# Patient Record
Sex: Female | Born: 1950 | Hispanic: No | Marital: Single | Smoking: Never smoker
Health system: Southern US, Community
[De-identification: ages and names within clinical notes are randomized; demographics above are authoritative.]

## PROBLEM LIST (undated history)

## (undated) DIAGNOSIS — R51 Headache: Secondary | ICD-10-CM

## (undated) DIAGNOSIS — R5381 Other malaise: Secondary | ICD-10-CM

## (undated) DIAGNOSIS — M1990 Unspecified osteoarthritis, unspecified site: Secondary | ICD-10-CM

## (undated) DIAGNOSIS — R600 Localized edema: Secondary | ICD-10-CM

## (undated) DIAGNOSIS — I10 Essential (primary) hypertension: Secondary | ICD-10-CM

## (undated) DIAGNOSIS — R5383 Other fatigue: Secondary | ICD-10-CM

## (undated) DIAGNOSIS — R519 Headache, unspecified: Secondary | ICD-10-CM

## (undated) HISTORY — PX: OTHER SURGICAL HISTORY: SHX169

## (undated) HISTORY — PX: LEFT OOPHORECTOMY: SHX1961

## (undated) HISTORY — PX: TUBAL LIGATION: SHX77

## (undated) HISTORY — PX: ABDOMINAL HYSTERECTOMY: SHX81

## (undated) HISTORY — PX: DILATION AND CURETTAGE OF UTERUS: SHX78

## (undated) HISTORY — PX: TOTAL HIP ARTHROPLASTY: SHX124

## (undated) HISTORY — DX: Other fatigue: R53.83

## (undated) HISTORY — DX: Localized edema: R60.0

## (undated) HISTORY — PX: CHOLECYSTECTOMY: SHX55

## (undated) HISTORY — DX: Other malaise: R53.81

## (undated) HISTORY — PX: JOINT REPLACEMENT: SHX530

## (undated) HISTORY — PX: BACK SURGERY: SHX140

---

## 1998-06-12 ENCOUNTER — Emergency Department (HOSPITAL_COMMUNITY): Admission: EM | Admit: 1998-06-12 | Discharge: 1998-06-12 | Payer: Self-pay | Admitting: Emergency Medicine

## 1998-06-12 ENCOUNTER — Encounter: Payer: Self-pay | Admitting: Emergency Medicine

## 2016-11-07 ENCOUNTER — Encounter: Payer: Self-pay | Admitting: Orthopedic Surgery

## 2016-11-07 DIAGNOSIS — M1612 Unilateral primary osteoarthritis, left hip: Secondary | ICD-10-CM

## 2016-11-07 NOTE — H&P (Signed)
PREOPERATIVE H&P Patient ID: Janice Werner MRN: [id number] DOB/AGE: 05-04-1950 66 y.o.  Chief Complaint: OA LEFT HIP  Planned Procedure Date: 12/06/16 Medical and Cardiac Clearance by Azell Der, PA-C pending    HPI: Janice Werner is a 66 y.o. female who presents for evaluation of OA LEFT HIP. The patient has a history of pain and functional disability in the left hip due to arthritis and has failed non-surgical conservative treatments for greater than 12 weeks to include NSAID's and/or analgesics, use of assistive devices and weight reduction as appropriate.  Onset of symptoms was gradual, starting 9 years ago with gradually worsening course since that time.  Patient currently rates pain at 10 out of 10 with activity. Patient has night pain, worsening of pain with activity and weight bearing and pain that interferes with activities of daily living.  Patient has evidence of subchondral cysts, subchondral sclerosis, periarticular osteophytes and joint space narrowing by imaging studies. There is no active infection.  Past Medical History: Denies chronic medical problems excepting chronic left hip pain.  Past Surgical History: Bone spur right foot 1976 Tubo ligation 1979 D&C 1980 Hysterectomy 1980 Cholecystectomy 1982  Left oophorectomy 1985 Lumbar disc surgery for fracture 1992  Allergies: Codine - GI upset.    Medications: OTC Tylenol prn pain in left hip    Social History: Single retired Midwife.  Never smoker, no etoh.  Lives alone with 7 cats.  Has a daughter that lives 15 minutes away.  Family History: Mother and Father with HTN  ROS: Currently denies lightheadedness, dizziness, Fever, chills, CP, SOB.  No personal history of DVT, PE, MI, or CVA. No loose teeth.  Top dentures All other systems have been reviewed and were otherwise currently negative with the exception of those mentioned in the HPI and as above.  Objective: Vitals: Ht: 5'5" Wt: 247  Temp: 98.2 BP: 145/74 Pulse: 92 O2 96% on room air. Physical Exam: General: Alert, NAD. Trendelenberg Gait  HEENT: EOMI, Good Neck Extension  Pulm: No increased work of breathing.  Clear B/L A/P w/o crackle or wheeze.  CV: RRR, No m/g/r appreciated  GI: soft, NT, ND Neuro: Neuro without gross focal deficit.  Sensation intact distally Skin: No lesions in the area of chief complaint MSK/Surgical Site: Left Hip tender over greater trochanter.  Pain with passive ROM.  Positive Stinchfield.  5/5 strength.  NVI.  Sensation intact distally.  Imaging Review Plain radiographs demonstrate severe degenerative joint disease of the left hip.   Assessment: OA LEFT HIP Principal Problem:   Primary osteoarthritis of left hip   Plan: Plan for Procedure(s): TOTAL HIP ARTHROPLASTY ANTERIOR APPROACH  The patient history, physical exam, clinical judgement of the provider and imaging are consistent with end stage degenerative joint disease and total joint arthroplasty is deemed medically necessary. The treatment options including medical management, injection therapy, and arthroplasty were discussed at length. The risks and benefits of Procedure(s): TOTAL HIP ARTHROPLASTY ANTERIOR APPROACH were presented and reviewed.  The risks of nonoperative treatment, versus surgical intervention including but not limited to continued pain, aseptic loosening, stiffness, dislocation/subluxation, infection, bleeding, nerve injury, blood clots, cardiopulmonary complications, morbidity, mortality, among others were discussed. The patient verbalizes understanding and wishes to proceed with the plan.  Patient is being admitted for inpatient treatment for surgery, pain control, PT, OT, prophylactic antibiotics, VTE prophylaxis, progressive ambulation, ADL's and discharge planning.   Dental prophylaxis discussed and recommended for 2 years postoperatively.   The patient does meet the criteria for TXA  which will be used  perioperatively via IV.    ASA 325 mg will be used postoperatively for DVT prophylaxis in addition to SCDs, and early ambulation.  The patient is planning to be discharged home with home health services ([facility]) in care of her daughter.  Albina Billet III, PA-C 11/07/2016 4:51 PM

## 2016-11-16 ENCOUNTER — Other Ambulatory Visit: Payer: Self-pay | Admitting: Cardiology

## 2016-11-16 DIAGNOSIS — I10 Essential (primary) hypertension: Secondary | ICD-10-CM

## 2016-11-25 ENCOUNTER — Encounter (HOSPITAL_COMMUNITY): Payer: Self-pay

## 2016-11-25 ENCOUNTER — Encounter (HOSPITAL_COMMUNITY)
Admission: RE | Admit: 2016-11-25 | Discharge: 2016-11-25 | Disposition: A | Discharge status: Home / Self Care | Payer: Medicare HMO | Source: Ambulatory Visit | Attending: Orthopedic Surgery | Admitting: Orthopedic Surgery

## 2016-11-25 DIAGNOSIS — Z01818 Encounter for other preprocedural examination: Secondary | ICD-10-CM | POA: Diagnosis present

## 2016-11-25 HISTORY — DX: Essential (primary) hypertension: I10

## 2016-11-25 HISTORY — DX: Unspecified osteoarthritis, unspecified site: M19.90

## 2016-11-25 HISTORY — DX: Headache, unspecified: R51.9

## 2016-11-25 HISTORY — DX: Headache: R51

## 2016-11-25 LAB — CBC
HEMATOCRIT: 42.7 % (ref 36.0–46.0)
Hemoglobin: 13.7 g/dL (ref 12.0–15.0)
MCH: 29.7 pg (ref 26.0–34.0)
MCHC: 32.1 g/dL (ref 30.0–36.0)
MCV: 92.4 fL (ref 78.0–100.0)
Platelets: 264 10*3/uL (ref 150–400)
RBC: 4.62 MIL/uL (ref 3.87–5.11)
RDW: 14.6 % (ref 11.5–15.5)
WBC: 6.5 10*3/uL (ref 4.0–10.5)

## 2016-11-25 LAB — BASIC METABOLIC PANEL
ANION GAP: 9 (ref 5–15)
BUN: 18 mg/dL (ref 6–20)
CALCIUM: 9 mg/dL (ref 8.9–10.3)
CO2: 24 mmol/L (ref 22–32)
Chloride: 109 mmol/L (ref 101–111)
Creatinine, Ser: 0.83 mg/dL (ref 0.44–1.00)
GFR calc non Af Amer: >60 mL/min (ref >60)
GLUCOSE: 93 mg/dL (ref 65–99)
POTASSIUM: 3.8 mmol/L (ref 3.5–5.1)
Sodium: 142 mmol/L (ref 135–145)

## 2016-11-25 LAB — SURGICAL PCR SCREEN
MRSA, PCR: NEGATIVE
Staphylococcus aureus: NEGATIVE

## 2016-11-25 NOTE — Progress Notes (Signed)
Dr. Jorja Loaim Murphy's office notified that we do not have TED's that will fit pt. Calf size.

## 2016-11-25 NOTE — Pre-Procedure Instructions (Addendum)
    Janice Werner  11/25/2016        Your procedure is scheduled on 12-06-2016  Tuesday .  [facility]:00 [facility].M.   Call this number if you have problems the morning of surgery:  [phone]   Remember:  Do not eat food or drink liquids after midnight.   Take these medicines the morning of surgery with A SIP OF WATER Amlodipine   STOP ASPIRIN,ANTIINFLAMATORIES (IBUPROFEN,ALEVE,MOTRIN,ADVIL,GOODY'S POWDERS),HERBAL SUPPLEMENTS,FISH OIL,AND VITAMINS 5-7 DAYS PRIOR TO SURGERY   Do not wear jewelry, make-up or nail polish.  Do not wear lotions, powders, or perfumes, or deoderant.  Do not shave 48 hours prior to surgery.  Men may shave face and neck.   Do not bring valuables to the hospital.  [facility] is not responsible for any belongings or valuables.  Contacts, dentures or bridgework may not be worn into surgery.  Leave your suitcase in the car.  After surgery it may be brought to your room.  For patients admitted to the hospital, discharge time will be determined by your treatment team.  Patients discharged the day of surgery will not be allowed to drive home.      Please read over the following fact sheets that you were given. MRSA Information and Surgical Site Infection Prevention,Incentive Spirometry

## 2016-11-25 NOTE — Progress Notes (Addendum)
PCP Dr. Charlies SilversJennifer Couillard PA  Cardiologist  Dr. Jacinto HalimGanji  Stress test  11-22-2016  Dr. Jacinto HalimGanji   Echo 11-24-2016  EKG 11-25-2016  Dr. Ardyth GalJanji   All Cardiac testing , ov , EKG requested from Dr. Mickle MalloryJanji's office

## 2016-11-28 NOTE — Progress Notes (Signed)
Anesthesia Chart Review:  Pt is a 66 year old female scheduled for L total hip arthroplasty anterior approach on 12/06/2016 with Margarita Ranaimothy Murphy, MD  - PCP is Charlies SilversJennifer Couillard, PA - Saw Lunette StandsAston Kelley, NP at [facility] on 11/16/16 for pre-op eval.  Stress test and echo ordered, results below.   PMH includes:  HTN. Never smoker. BMI 39  Medications include: amlodipine, losartan.   Preoperative labs reviewed.    EKG 8/259/18 [facility]): NSR. Nonspecific T abnormality. Low voltage complexes.   Nuclear stress test 11/25/16 [facility]):  1. Resting EKG NSR, poor R wave progression. Stress EKG nondiagnostic as test is pharmacologic. Stress symptoms include: dyspnea, dizziness 2. Raw images reveal breast attenuation in the inferior and septal regions. Superimposed small sized moderate ischemia in the mid anteroseptal and apical septal myocardial walls cannot be excluded. Gated SPECT images reveal normal myocardial thickening and wall motion. LV EF calculated to be 56%. This is a low risk study.   [facility]):  1. LV cavity normal in size. Mild concentric LVH. Normal global wall motion. Indeterminate diastolic filling pattern. Calculated EF 61%.  2. LA cavity is mildly dilated.  3. Mild (Grade I) mitral regurgitation.  4. Mild to moderate tricuspid regurgitation. Pulmonary artery systolic pressure is estimated at 28-33 mmHg.   If no changes, I anticipate pt can proceed with surgery as scheduled.   Rica Mastngela Tala Eber, FNP-BC [facility]/Anesthesiology Phone: [phone] 11/28/2016 4:38 PM

## 2016-12-05 ENCOUNTER — Other Ambulatory Visit: Payer: Self-pay | Admitting: Radiology

## 2016-12-05 MED ORDER — TRANEXAMIC ACID 1000 MG/10ML IV SOLN
2000.0000 mg | INTRAVENOUS | Status: AC
  Administered 2016-12-06: 2000 mg via TOPICAL
  Filled 2016-12-05: qty 20

## 2016-12-05 MED ORDER — CEFAZOLIN SODIUM-DEXTROSE 2-4 GM/100ML-% IV SOLN
2.0000 g | INTRAVENOUS | Status: AC
  Administered 2016-12-06: 2 g via INTRAVENOUS
  Filled 2016-12-05: qty 100

## 2016-12-05 MED ORDER — TRANEXAMIC ACID 1000 MG/10ML IV SOLN
1000.0000 mg | INTRAVENOUS | Status: AC
  Administered 2016-12-06: 1000 mg via INTRAVENOUS
  Filled 2016-12-05: qty 10

## 2016-12-05 MED ORDER — LACTATED RINGERS IV SOLN: INTRAVENOUS | Status: DC

## 2016-12-05 MED ORDER — GABAPENTIN 300 MG PO CAPS
300.0000 mg | ORAL_CAPSULE | Freq: Once | ORAL | Status: AC
  Administered 2016-12-06: 300 mg via ORAL
  Filled 2016-12-05: qty 1

## 2016-12-05 MED ORDER — ACETAMINOPHEN 500 MG PO TABS
1000.0000 mg | ORAL_TABLET | Freq: Once | ORAL | Status: AC
  Administered 2016-12-06: 1000 mg via ORAL
  Filled 2016-12-05: qty 2

## 2016-12-06 ENCOUNTER — Inpatient Hospital Stay (HOSPITAL_COMMUNITY): Payer: Medicare HMO

## 2016-12-06 ENCOUNTER — Encounter (HOSPITAL_COMMUNITY)
Admission: RE | Disposition: A | Discharge status: Home Health | Payer: Self-pay | Source: Ambulatory Visit | Attending: Orthopedic Surgery

## 2016-12-06 ENCOUNTER — Inpatient Hospital Stay (HOSPITAL_COMMUNITY): Payer: Medicare HMO | Admitting: Emergency Medicine

## 2016-12-06 ENCOUNTER — Encounter (HOSPITAL_COMMUNITY): Payer: Self-pay | Admitting: *Deleted

## 2016-12-06 ENCOUNTER — Inpatient Hospital Stay (HOSPITAL_COMMUNITY)
Admission: RE | Admit: 2016-12-06 | Discharge: 2016-12-08 | DRG: 470 | Disposition: A | Discharge status: Home Health | Payer: Medicare HMO | Source: Ambulatory Visit | Attending: Orthopedic Surgery | Admitting: Orthopedic Surgery

## 2016-12-06 DIAGNOSIS — I10 Essential (primary) hypertension: Secondary | ICD-10-CM | POA: Diagnosis present

## 2016-12-06 DIAGNOSIS — E669 Obesity, unspecified: Secondary | ICD-10-CM | POA: Diagnosis present

## 2016-12-06 DIAGNOSIS — M1612 Unilateral primary osteoarthritis, left hip: Secondary | ICD-10-CM | POA: Diagnosis present

## 2016-12-06 DIAGNOSIS — Z885 Allergy status to narcotic agent status: Secondary | ICD-10-CM

## 2016-12-06 DIAGNOSIS — Z96649 Presence of unspecified artificial hip joint: Secondary | ICD-10-CM

## 2016-12-06 DIAGNOSIS — Z419 Encounter for procedure for purposes other than remedying health state, unspecified: Secondary | ICD-10-CM

## 2016-12-06 HISTORY — PX: TOTAL HIP ARTHROPLASTY: SHX124

## 2016-12-06 SURGERY — ARTHROPLASTY, HIP, TOTAL, ANTERIOR APPROACH
Anesthesia: Spinal | Site: Hip | Laterality: Left

## 2016-12-06 MED ORDER — ONDANSETRON HCL 4 MG PO TABS: 4.0000 mg | ORAL_TABLET | Freq: Three times a day (TID) | ORAL | 0 refills | Status: DC | PRN

## 2016-12-06 MED ORDER — FLEET ENEMA 7-19 GM/118ML RE ENEM: 1.0000 | ENEMA | Freq: Once | RECTAL | Status: DC | PRN

## 2016-12-06 MED ORDER — FENTANYL CITRATE (PF) 100 MCG/2ML IJ SOLN
INTRAMUSCULAR | Status: DC | PRN
  Administered 2016-12-06: 50 ug via INTRAVENOUS

## 2016-12-06 MED ORDER — MENTHOL 3 MG MT LOZG: 1.0000 | LOZENGE | OROMUCOSAL | Status: DC | PRN

## 2016-12-06 MED ORDER — POLYETHYLENE GLYCOL 3350 17 G PO PACK: 17.0000 g | PACK | Freq: Every day | ORAL | Status: DC | PRN

## 2016-12-06 MED ORDER — SODIUM CHLORIDE FLUSH 0.9 % IV SOLN
INTRAVENOUS | Status: DC | PRN
  Administered 2016-12-06 (×3): 10 mL

## 2016-12-06 MED ORDER — METOCLOPRAMIDE HCL 5 MG/ML IJ SOLN: 5.0000 mg | Freq: Three times a day (TID) | INTRAMUSCULAR | Status: DC | PRN

## 2016-12-06 MED ORDER — SORBITOL 70 % SOLN: 30.0000 mL | Freq: Every day | Status: DC | PRN

## 2016-12-06 MED ORDER — ONDANSETRON HCL 4 MG/2ML IJ SOLN: 4.0000 mg | Freq: Four times a day (QID) | INTRAMUSCULAR | Status: DC | PRN

## 2016-12-06 MED ORDER — BUPIVACAINE-EPINEPHRINE 0.25% -1:200000 IJ SOLN
INTRAMUSCULAR | Status: DC | PRN
  Administered 2016-12-06: 30 mL

## 2016-12-06 MED ORDER — BUPIVACAINE IN DEXTROSE 0.75-8.25 % IT SOLN
INTRATHECAL | Status: DC | PRN
  Administered 2016-12-06: 2 mL via INTRATHECAL

## 2016-12-06 MED ORDER — HYDROMORPHONE HCL 1 MG/ML IJ SOLN
0.5000 mg | INTRAMUSCULAR | Status: DC | PRN
  Administered 2016-12-06: 1 mg via INTRAVENOUS
  Filled 2016-12-06: qty 1

## 2016-12-06 MED ORDER — DEXAMETHASONE SODIUM PHOSPHATE 10 MG/ML IJ SOLN
10.0000 mg | Freq: Once | INTRAMUSCULAR | Status: AC
  Administered 2016-12-07: 10 mg via INTRAVENOUS
  Filled 2016-12-06: qty 1

## 2016-12-06 MED ORDER — LACTATED RINGERS IV SOLN
INTRAVENOUS | Status: DC
  Administered 2016-12-06: 23:00:00 via INTRAVENOUS

## 2016-12-06 MED ORDER — OMEPRAZOLE 20 MG PO CPDR: 20.0000 mg | DELAYED_RELEASE_CAPSULE | Freq: Every day | ORAL | 0 refills | Status: DC

## 2016-12-06 MED ORDER — SENNA 8.6 MG PO TABS
1.0000 | ORAL_TABLET | Freq: Two times a day (BID) | ORAL | Status: DC
  Administered 2016-12-06 – 2016-12-08 (×4): 8.6 mg via ORAL
  Filled 2016-12-06 (×4): qty 1

## 2016-12-06 MED ORDER — LOSARTAN POTASSIUM 25 MG PO TABS
25.0000 mg | ORAL_TABLET | Freq: Every day | ORAL | Status: DC
  Administered 2016-12-06: 25 mg via ORAL
  Filled 2016-12-06 (×2): qty 1

## 2016-12-06 MED ORDER — MIDAZOLAM HCL 5 MG/5ML IJ SOLN
INTRAMUSCULAR | Status: DC | PRN
  Administered 2016-12-06: 2 mg via INTRAVENOUS

## 2016-12-06 MED ORDER — DIPHENHYDRAMINE HCL 12.5 MG/5ML PO ELIX: 12.5000 mg | ORAL_SOLUTION | ORAL | Status: DC | PRN

## 2016-12-06 MED ORDER — 0.9 % SODIUM CHLORIDE (POUR BTL) OPTIME
TOPICAL | Status: DC | PRN
  Administered 2016-12-06: 1000 mL

## 2016-12-06 MED ORDER — ACETAMINOPHEN 325 MG PO TABS: 650.0000 mg | ORAL_TABLET | Freq: Four times a day (QID) | ORAL | Status: DC | PRN

## 2016-12-06 MED ORDER — HYDROCODONE-ACETAMINOPHEN 5-325 MG PO TABS
1.0000 | ORAL_TABLET | ORAL | Status: DC | PRN
  Administered 2016-12-06: 1 via ORAL
  Administered 2016-12-06 – 2016-12-07 (×3): 2 via ORAL
  Administered 2016-12-07: 1 via ORAL
  Administered 2016-12-08: 2 via ORAL
  Filled 2016-12-06: qty 2
  Filled 2016-12-06 (×2): qty 1
  Filled 2016-12-06 (×3): qty 2

## 2016-12-06 MED ORDER — ASPIRIN EC 325 MG PO TBEC: 325.0000 mg | DELAYED_RELEASE_TABLET | Freq: Every day | ORAL | 0 refills | Status: DC

## 2016-12-06 MED ORDER — AMLODIPINE BESYLATE 5 MG PO TABS
5.0000 mg | ORAL_TABLET | Freq: Every day | ORAL | Status: DC
  Administered 2016-12-08: 5 mg via ORAL
  Filled 2016-12-06 (×2): qty 1

## 2016-12-06 MED ORDER — BUPIVACAINE-EPINEPHRINE (PF) 0.25% -1:200000 IJ SOLN
INTRAMUSCULAR | Status: AC
  Filled 2016-12-06: qty 30

## 2016-12-06 MED ORDER — CELECOXIB 200 MG PO CAPS
200.0000 mg | ORAL_CAPSULE | Freq: Two times a day (BID) | ORAL | 0 refills | Status: AC
Start: 2016-12-06 — End: 2017-12-06

## 2016-12-06 MED ORDER — CEFAZOLIN SODIUM-DEXTROSE 2-4 GM/100ML-% IV SOLN
2.0000 g | Freq: Four times a day (QID) | INTRAVENOUS | Status: AC
  Administered 2016-12-06 – 2016-12-07 (×2): 2 g via INTRAVENOUS
  Filled 2016-12-06 (×2): qty 100

## 2016-12-06 MED ORDER — CHLORHEXIDINE GLUCONATE 4 % EX LIQD: 60.0000 mL | Freq: Once | CUTANEOUS | Status: DC

## 2016-12-06 MED ORDER — METHOCARBAMOL 500 MG PO TABS
ORAL_TABLET | ORAL | Status: AC
  Filled 2016-12-06: qty 1

## 2016-12-06 MED ORDER — LIDOCAINE HCL (CARDIAC) 20 MG/ML IV SOLN
INTRAVENOUS | Status: DC | PRN
  Administered 2016-12-06: 60 mg via INTRAVENOUS

## 2016-12-06 MED ORDER — PROPOFOL 500 MG/50ML IV EMUL
INTRAVENOUS | Status: DC | PRN
  Administered 2016-12-06: 40 ug/kg/min via INTRAVENOUS

## 2016-12-06 MED ORDER — HYDROCODONE-ACETAMINOPHEN 5-325 MG PO TABS: 1.0000 | ORAL_TABLET | ORAL | 0 refills | Status: DC | PRN

## 2016-12-06 MED ORDER — METOCLOPRAMIDE HCL 5 MG PO TABS: 5.0000 mg | ORAL_TABLET | Freq: Three times a day (TID) | ORAL | Status: DC | PRN

## 2016-12-06 MED ORDER — DOCUSATE SODIUM 100 MG PO CAPS: 100.0000 mg | ORAL_CAPSULE | Freq: Two times a day (BID) | ORAL | 0 refills | Status: DC

## 2016-12-06 MED ORDER — FENTANYL CITRATE (PF) 100 MCG/2ML IJ SOLN
25.0000 ug | INTRAMUSCULAR | Status: DC | PRN
  Administered 2016-12-06 (×3): 50 ug via INTRAVENOUS

## 2016-12-06 MED ORDER — KETOROLAC TROMETHAMINE 30 MG/ML IJ SOLN
INTRAMUSCULAR | Status: DC | PRN
  Administered 2016-12-06: 30 mg via INTRA_ARTICULAR

## 2016-12-06 MED ORDER — ONDANSETRON HCL 4 MG PO TABS: 4.0000 mg | ORAL_TABLET | Freq: Four times a day (QID) | ORAL | Status: DC | PRN

## 2016-12-06 MED ORDER — ASPIRIN EC 325 MG PO TBEC
325.0000 mg | DELAYED_RELEASE_TABLET | Freq: Every day | ORAL | Status: DC
  Administered 2016-12-07 – 2016-12-08 (×2): 325 mg via ORAL
  Filled 2016-12-06 (×2): qty 1

## 2016-12-06 MED ORDER — PRAVASTATIN SODIUM 20 MG PO TABS
10.0000 mg | ORAL_TABLET | Freq: Every day | ORAL | Status: DC
  Administered 2016-12-07: 10 mg via ORAL
  Filled 2016-12-06: qty 1

## 2016-12-06 MED ORDER — FENTANYL CITRATE (PF) 100 MCG/2ML IJ SOLN
INTRAMUSCULAR | Status: AC
  Administered 2016-12-06: 50 ug via INTRAVENOUS
  Filled 2016-12-06: qty 2

## 2016-12-06 MED ORDER — METHOCARBAMOL 500 MG PO TABS
500.0000 mg | ORAL_TABLET | Freq: Four times a day (QID) | ORAL | Status: DC | PRN
  Administered 2016-12-06 – 2016-12-07 (×3): 500 mg via ORAL
  Filled 2016-12-06 (×2): qty 1

## 2016-12-06 MED ORDER — LACTATED RINGERS IV SOLN
INTRAVENOUS | Status: DC
  Administered 2016-12-06: 11:00:00 via INTRAVENOUS

## 2016-12-06 MED ORDER — PHENOL 1.4 % MT LIQD: 1.0000 | OROMUCOSAL | Status: DC | PRN

## 2016-12-06 MED ORDER — FENTANYL CITRATE (PF) 250 MCG/5ML IJ SOLN
INTRAMUSCULAR | Status: AC
  Filled 2016-12-06: qty 5

## 2016-12-06 MED ORDER — METHOCARBAMOL 1000 MG/10ML IJ SOLN
500.0000 mg | Freq: Four times a day (QID) | INTRAVENOUS | Status: DC | PRN
  Filled 2016-12-06: qty 5

## 2016-12-06 MED ORDER — METHOCARBAMOL 500 MG PO TABS: 500.0000 mg | ORAL_TABLET | Freq: Four times a day (QID) | ORAL | 0 refills | Status: DC | PRN

## 2016-12-06 MED ORDER — LACTATED RINGERS IV SOLN
INTRAVENOUS | Status: DC | PRN
  Administered 2016-12-06 (×2): via INTRAVENOUS

## 2016-12-06 MED ORDER — PROPOFOL 10 MG/ML IV BOLUS
INTRAVENOUS | Status: AC
  Filled 2016-12-06: qty 20

## 2016-12-06 MED ORDER — MIDAZOLAM HCL 2 MG/2ML IJ SOLN
INTRAMUSCULAR | Status: AC
  Filled 2016-12-06: qty 2

## 2016-12-06 MED ORDER — HYDROCODONE-ACETAMINOPHEN 5-325 MG PO TABS
ORAL_TABLET | ORAL | Status: AC
  Filled 2016-12-06: qty 2

## 2016-12-06 MED ORDER — ONDANSETRON HCL 4 MG/2ML IJ SOLN: 4.0000 mg | Freq: Once | INTRAMUSCULAR | Status: DC | PRN

## 2016-12-06 MED ORDER — FENTANYL CITRATE (PF) 100 MCG/2ML IJ SOLN
INTRAMUSCULAR | Status: AC
  Filled 2016-12-06: qty 2

## 2016-12-06 MED ORDER — CELECOXIB 200 MG PO CAPS
200.0000 mg | ORAL_CAPSULE | Freq: Two times a day (BID) | ORAL | Status: DC
  Administered 2016-12-06 – 2016-12-08 (×4): 200 mg via ORAL
  Filled 2016-12-06 (×5): qty 1

## 2016-12-06 MED ORDER — DOCUSATE SODIUM 100 MG PO CAPS
100.0000 mg | ORAL_CAPSULE | Freq: Two times a day (BID) | ORAL | Status: DC
  Administered 2016-12-06 – 2016-12-08 (×4): 100 mg via ORAL
  Filled 2016-12-06 (×4): qty 1

## 2016-12-06 MED ORDER — PHENYLEPHRINE HCL 10 MG/ML IJ SOLN
INTRAVENOUS | Status: DC | PRN
  Administered 2016-12-06: 15 ug/min via INTRAVENOUS

## 2016-12-06 MED ORDER — ACETAMINOPHEN 650 MG RE SUPP: 650.0000 mg | Freq: Four times a day (QID) | RECTAL | Status: DC | PRN

## 2016-12-06 SURGICAL SUPPLY — 50 items
BAG DECANTER FOR FLEXI CONT (MISCELLANEOUS) ×2 IMPLANT
BLADE SAG 18X100X1.27 (BLADE) ×2 IMPLANT
CAPT HIP TOTAL 2 ×3 IMPLANT
CLOSURE STERI-STRIP 1/2X4 (GAUZE/BANDAGES/DRESSINGS) ×1
CLOSURE WOUND 1/2 X4 (GAUZE/BANDAGES/DRESSINGS) ×1
CLSR STERI-STRIP ANTIMIC 1/2X4 (GAUZE/BANDAGES/DRESSINGS) ×2 IMPLANT
COVER PERINEAL POST (MISCELLANEOUS) ×3 IMPLANT
COVER SURGICAL LIGHT HANDLE (MISCELLANEOUS) ×3 IMPLANT
DRAPE C-ARM 42X72 X-RAY (DRAPES) ×3 IMPLANT
DRAPE STERI IOBAN 125X83 (DRAPES) ×3 IMPLANT
DRAPE U-SHAPE 47X51 STRL (DRAPES) IMPLANT
DRSG MEPILEX BORDER 4X8 (GAUZE/BANDAGES/DRESSINGS) ×3 IMPLANT
DURAPREP 26ML APPLICATOR (WOUND CARE) ×3 IMPLANT
ELECT BLADE 4.0 EZ CLEAN MEGAD (MISCELLANEOUS) ×3
ELECT REM PT RETURN 9FT ADLT (ELECTROSURGICAL) ×3
ELECTRODE BLDE 4.0 EZ CLN MEGD (MISCELLANEOUS) ×1 IMPLANT
ELECTRODE REM PT RTRN 9FT ADLT (ELECTROSURGICAL) ×1 IMPLANT
FACESHIELD WRAPAROUND (MASK) ×6 IMPLANT
FACESHIELD WRAPAROUND OR TEAM (MASK) ×2 IMPLANT
GLOVE BIO SURGEON STRL SZ7.5 (GLOVE) ×6 IMPLANT
GLOVE BIOGEL PI IND STRL 8 (GLOVE) ×2 IMPLANT
GLOVE BIOGEL PI INDICATOR 8 (GLOVE) ×4
GOWN STRL REUS W/ TWL LRG LVL3 (GOWN DISPOSABLE) ×2 IMPLANT
GOWN STRL REUS W/TWL LRG LVL3 (GOWN DISPOSABLE) ×6
KIT BASIN OR (CUSTOM PROCEDURE TRAY) ×3 IMPLANT
KIT ROOM TURNOVER OR (KITS) ×3 IMPLANT
MANIFOLD NEPTUNE II (INSTRUMENTS) ×3 IMPLANT
NDL SAFETY ECLIPSE 18X1.5 (NEEDLE) IMPLANT
NEEDLE HYPO 18GX1.5 SHARP (NEEDLE) ×3
NEEDLE HYPO 22GX1.5 SAFETY (NEEDLE) ×3 IMPLANT
NS IRRIG 1000ML POUR BTL (IV SOLUTION) ×3 IMPLANT
PACK TOTAL JOINT (CUSTOM PROCEDURE TRAY) ×3 IMPLANT
PAD ARMBOARD 7.5X6 YLW CONV (MISCELLANEOUS) ×3 IMPLANT
SPONGE LAP 18X18 X RAY DECT (DISPOSABLE) IMPLANT
STRIP CLOSURE SKIN 1/2X4 (GAUZE/BANDAGES/DRESSINGS) ×2 IMPLANT
SUT MNCRL AB 4-0 PS2 18 (SUTURE) ×3 IMPLANT
SUT MON AB 2-0 CT1 36 (SUTURE) ×3 IMPLANT
SUT VIC AB 0 CT1 27 (SUTURE) ×3
SUT VIC AB 0 CT1 27XBRD ANBCTR (SUTURE) ×1 IMPLANT
SUT VIC AB 1 CT1 27 (SUTURE) ×3
SUT VIC AB 1 CT1 27XBRD ANBCTR (SUTURE) ×1 IMPLANT
SYR 50ML LL SCALE MARK (SYRINGE) ×3 IMPLANT
SYR BULB IRRIGATION 50ML (SYRINGE) ×3 IMPLANT
SYRINGE 20CC LL (MISCELLANEOUS) IMPLANT
TOWEL OR 17X24 6PK STRL BLUE (TOWEL DISPOSABLE) ×3 IMPLANT
TOWEL OR 17X26 10 PK STRL BLUE (TOWEL DISPOSABLE) ×3 IMPLANT
TRAY CATH 16FR W/PLASTIC CATH (SET/KITS/TRAYS/PACK) IMPLANT
TRAY FOLEY W/METER SILVER 16FR (SET/KITS/TRAYS/PACK) IMPLANT
WATER STERILE IRR 1000ML POUR (IV SOLUTION) ×3 IMPLANT
YANKAUER SUCT BULB TIP NO VENT (SUCTIONS) ×6 IMPLANT

## 2016-12-06 NOTE — Op Note (Signed)
12/06/2016  3:23 PM  PATIENT:  Janice Werner   MRN: [id number]  PRE-OPERATIVE DIAGNOSIS:  OA LEFT HIP  POST-OPERATIVE DIAGNOSIS:  OA LEFT HIP  PROCEDURE:  Procedure(s): LEFT TOTAL HIP ARTHROPLASTY ANTERIOR APPROACH  PREOPERATIVE INDICATIONS:    Janice Werner is an 66 y.o. female who has a diagnosis of Primary osteoarthritis of left hip and elected for surgical management after failing conservative treatment.  The risks benefits and alternatives were discussed with the patient including but not limited to the risks of nonoperative treatment, versus surgical intervention including infection, bleeding, nerve injury, periprosthetic fracture, the need for revision surgery, dislocation, leg length discrepancy, blood clots, cardiopulmonary complications, morbidity, mortality, among others, and they were willing to proceed.     OPERATIVE REPORT     SURGEON:   Burnett Lieber, Ernesta Amble, MD    ASSISTANT:  Roxan Hockey, PA-C, he was present and scrubbed throughout the case, critical for completion in a timely fashion, and for retraction, instrumentation, and closure.     ANESTHESIA:  General    COMPLICATIONS:  None.     COMPONENTS:  Stryker acolade fit femur size 5 with a 36 mm -0 head ball and a PSL acetabular shell size 54 with a  polyethylene liner    PROCEDURE IN DETAIL:   The patient was met in the holding area and  identified.  The appropriate hip was identified and marked at the operative site.  The patient was then transported to the OR  and  placed under anesthesia per that record.  At that point, the patient was  placed in the supine position and  secured to the operating room table and all bony prominences padded. He received pre-operative antibiotics    The operative lower extremity was prepped from the iliac crest to the distal leg.  Sterile draping was performed.  Time out was performed prior to incision.      Skin incision was made just 2 cm lateral to the ASIS  extending  in line with the tensor fascia lata. Electrocautery was used to control all bleeders. I dissected down sharply to the fascia of the tensor fascia lata was confirmed that the muscle fibers beneath were running posteriorly. I then incised the fascia over the superficial tensor fascia lata in line with the incision. The fascia was elevated off the anterior aspect of the muscle the muscle was retracted posteriorly and protected throughout the case. I then used electrocautery to incise the tensor fascia lata fascia control and all bleeders. Immediately visible was the fat over top of the anterior neck and capsule.  I removed the anterior fat from the capsule and elevated the rectus muscle off of the anterior capsule. I then removed a large time of capsule. The retractors were then placed over the anterior acetabulum as well as around the superior and inferior neck.  I then removed a section of the femoral neck and a napkin ring fashion. Then used the power course to remove the femoral head from the acetabulum and thoroughly irrigated the acetabulum. I sized the femoral head.    I then exposed the deep acetabulum, cleared out any tissue including the ligamentum teres.   After adequate visualization, I excised the labrum, and then sequentially reamed.  I then impacted the acetabular implant into place using fluoroscopy for guidance.  Appropriate version and inclination was confirmed clinically matching their bony anatomy, and with fluoroscopy.  I placed a 20 mm screw in the posterior/superio position with an excellent bite.  I then placed the polyethylene liner in place  I then adducted the leg and released the external rotators from the posterior femur allowing it to be easily delivered up lateral and anterior to the acetabulum for preparation of the femoral canal.    I then prepared the proximal femur using the cookie-cutter and then sequentially reamed and broached.  A trial broach, neck, and head was  utilized, and I reduced the hip and used floroscopy to assess the neck length and femoral implant.  I then impacted the femoral prosthesis into place into the appropriate version. The hip was then reduced and fluoroscopy confirmed appropriate position. Leg lengths were restored.  I then irrigated the hip copiously again with, and repaired the fascia with Vicryl, followed by monocryl for the subcutaneous tissue, Monocryl for the skin, Steri-Strips and sterile gauze. The patient was then awakened and returned to PACU in stable and satisfactory condition. There were no complications.  POST OPERATIVE PLAN: WBAT, DVT px: SCD's/TED, ambulation and chemical dvt px  Prentis Langdon, MD Orthopedic Surgeon [phone]     

## 2016-12-06 NOTE — Transfer of Care (Signed)
Immediate Anesthesia Transfer of Care Note  Patient: Janice Werner  Procedure(s) Performed: Procedure(s): LEFT TOTAL HIP ARTHROPLASTY ANTERIOR APPROACH (Left)  Patient Location: PACU  Anesthesia Type:MAC combined with regional for post-op pain  Level of Consciousness: awake, alert , oriented and patient cooperative  Airway & Oxygen Therapy: Patient Spontanous Breathing and Patient connected to nasal cannula oxygen  Post-op Assessment: Report given to RN and Post -op Vital signs reviewed and stable  Post vital signs: Reviewed and stable  Last Vitals:  Vitals:   12/06/16 1605 12/06/16 1620  BP: 124/77 118/66  Pulse:    Resp:  15  Temp:    SpO2:      Last Pain:  Vitals:   12/06/16 1109  TempSrc: Oral  PainSc:          Complications: No apparent anesthesia complications

## 2016-12-06 NOTE — Anesthesia Postprocedure Evaluation (Signed)
Anesthesia Post Note  Patient: Janice Werner  Procedure(s) Performed: Procedure(s) (LRB): LEFT TOTAL HIP ARTHROPLASTY ANTERIOR APPROACH (Left)     Patient location during evaluation: PACU Anesthesia Type: Spinal Level of consciousness: oriented and awake and alert Pain management: pain level controlled Vital Signs Assessment: post-procedure vital signs reviewed and stable Respiratory status: spontaneous breathing, respiratory function stable and patient connected to nasal cannula oxygen Cardiovascular status: blood pressure returned to baseline and stable Postop Assessment: no headache, no backache and no apparent nausea or vomiting Anesthetic complications: no    Last Vitals:  Vitals:   12/06/16 1651 12/06/16 1700  BP: 99/84   Pulse: 84 79  Resp: (!) 21 14  Temp:    SpO2: 100% 96%    Last Pain:  Vitals:   12/06/16 1651  TempSrc:   PainSc: 10-Worst pain ever                 Ryan P Ellender

## 2016-12-06 NOTE — Anesthesia Preprocedure Evaluation (Addendum)
Anesthesia Evaluation  Patient identified by MRN, date of birth, ID band Patient awake    Reviewed: Allergy & Precautions, NPO status , Patient's Chart, lab work & pertinent test results  Airway Mallampati: II  TM Distance: >3 FB Neck ROM: Full    Dental  (+) Edentulous Upper, Missing   Pulmonary neg pulmonary ROS,    Pulmonary exam normal breath sounds clear to auscultation       Cardiovascular hypertension, Pt. on medications Normal cardiovascular exam Rhythm:Regular Rate:Normal  EKG 8/259/18 [facility]): NSR. Nonspecific T abnormality. Low voltage complexes.   Nuclear stress test 11/25/16 [facility]):  1. Resting EKG NSR, poor R wave progression. Stress EKG nondiagnostic as test is pharmacologic. Stress symptoms include: dyspnea, dizziness 2. Raw images reveal breast attenuation in the inferior and septal regions. Superimposed small sized moderate ischemia in the mid anteroseptal and apical septal myocardial walls cannot be excluded. Gated SPECT images reveal normal myocardial thickening and wall motion. LV EF calculated to be 56%. This is a low risk study.   Echo 11/24/16 [facility]):  1. LV cavity normal in size. Mild concentric LVH. Normal global wall motion. Indeterminate diastolic filling pattern. Calculated EF 61%.  2. LA cavity is mildly dilated.  3. Mild (Grade I) mitral regurgitation.  4. Mild to moderate tricuspid regurgitation. Pulmonary artery systolic pressure is estimated at 28-33 mmHg.    Neuro/Psych  Headaches, negative psych ROS   GI/Hepatic negative GI ROS, Neg liver ROS,   Endo/Other  negative endocrine ROS  Renal/GU negative Renal ROS     Musculoskeletal  (+) Arthritis , Osteoarthritis,    Abdominal (+) + obese,   Peds  Hematology negative hematology ROS (+)   Anesthesia Other Findings   Reproductive/Obstetrics                             Anesthesia Physical Anesthesia Plan  ASA: II  Anesthesia Plan: Spinal   Post-op Pain Management:    Induction: Intravenous  PONV Risk Score and Plan: 2 and Ondansetron, Dexamethasone and Propofol infusion  Airway Management Planned: Natural Airway  Additional Equipment:   Intra-op Plan:   Post-operative Plan:   Informed Consent: I have reviewed the patients History and Physical, chart, labs and discussed the procedure including the risks, benefits and alternatives for the proposed anesthesia with the patient or authorized representative who has indicated his/her understanding and acceptance.   Dental advisory given  Plan Discussed with: CRNA  Anesthesia Plan Comments:         Anesthesia Quick Evaluation

## 2016-12-06 NOTE — Discharge Instructions (Signed)

## 2016-12-06 NOTE — Interval H&P Note (Signed)
History and Physical Interval Note:  12/06/2016 12:53 PM  Janice Werner  has presented today for surgery, with the diagnosis of OA LEFT HIP  The various methods of treatment have been discussed with the patient and family. After consideration of risks, benefits and other options for treatment, the patient has consented to  Procedure(s): TOTAL HIP ARTHROPLASTY ANTERIOR APPROACH (Left) as a surgical intervention .  The patient's history has been reviewed, patient examined, no change in status, stable for surgery.  I have reviewed the patient's chart and labs.  Questions were answered to the patient's satisfaction.     Ladene Allocca D

## 2016-12-06 NOTE — Anesthesia Procedure Notes (Signed)
Spinal  Patient location during procedure: OR Start time: 12/06/2016 2:05 PM End time: 12/06/2016 2:15 PM Staffing Anesthesiologist: Karna Christmas P Performed: anesthesiologist  Preanesthetic Checklist Completed: patient identified, surgical consent, pre-op evaluation, timeout performed, IV checked, risks and benefits discussed and monitors and equipment checked Spinal Block Patient position: sitting Prep: DuraPrep Patient monitoring: cardiac monitor, continuous pulse ox and blood pressure Approach: left paramedian Location: L3-4 Injection technique: single-shot Needle Needle type: Pencan  Needle gauge: 24 G Needle length: 9 cm Assessment Sensory level: T10 Additional Notes Functioning IV was confirmed and monitors were applied. Sterile prep and drape, including hand hygiene and sterile gloves were used. The patient was positioned and the spine was prepped. The skin was anesthetized with lidocaine.  Free flow of clear CSF was obtained prior to injecting local anesthetic into the CSF.  The spinal needle aspirated freely following injection.  The needle was carefully withdrawn.  The patient tolerated the procedure well.

## 2016-12-06 NOTE — Anesthesia Procedure Notes (Signed)
Procedure Name: MAC Date/Time: 12/06/2016 2:36 PM Performed by: Oletta Lamas Pre-anesthesia Checklist: Patient identified, Emergency Drugs available, Suction available, Patient being monitored and Timeout performed Patient Re-evaluated:Patient Re-evaluated prior to induction Oxygen Delivery Method: Simple face mask

## 2016-12-07 ENCOUNTER — Encounter (HOSPITAL_COMMUNITY): Payer: Self-pay | Admitting: General Practice

## 2016-12-07 DIAGNOSIS — I10 Essential (primary) hypertension: Secondary | ICD-10-CM | POA: Diagnosis present

## 2016-12-07 NOTE — Care Management Note (Signed)
Case Management Note  Patient Details  Name: Janice Werner MRN: [id number] Date of Birth: 1950/08/20  Subjective/Objective:     66 yr old female s/p left total hip arthroplasty.             Action/Plan:  Case manager spoke with patient concerning discharge plan and DME. Patient was preoperatively setup with [facility], no change. Patient has RW, CM has requested 3in1. She will have family support at discharge.   Expected Discharge Date:  12/08/16           Expected Discharge Plan:  Home w Home Health Services  In-House Referral:  NA  Discharge planning Services  CM Consult  Post Acute Care Choice:  Durable Medical Equipment, Home Health Choice offered to:  Patient  DME Arranged:  3-N-1 DME Agency:  [facility]  HH Arranged:  PT HH Agency:  [facility] ([facility]  Status of Service:  Completed, signed off  If discussed at [employer], dates discussed:    Additional Comments:  Durenda Guthrie, RN 12/07/2016, 10:59 AM

## 2016-12-07 NOTE — Progress Notes (Signed)
Physical Therapy Treatment Patient Details Name: Janice Werner MRN: [id number] DOB: 1950/05/09 Today's Date: 12/07/2016    History of Present Illness Pt admitted for elective L direct ant THA. Pt with h/o L TKA and back surgery.    PT Comments    Pt con't to be limited by L hip pain and body habitus. Pt with antalgic gait and decreased endurance limiting ambulation tolerance. Pt has yet to confirm that she will have 24/7. Recommend pt staying another night to increase indep with transfers and ambulation. Acute PT to con't to follow.    Follow Up Recommendations  Home health PT;Supervision/Assistance - 24 hour     Equipment Recommendations  None recommended by PT    Recommendations for Other Services       Precautions / Restrictions Precautions Precautions: None Restrictions Weight Bearing Restrictions: Yes LLE Weight Bearing: Weight bearing as tolerated    Mobility  Bed Mobility Overal bed mobility: Needs Assistance Bed Mobility: Supine to Sit     Supine to sit: Min assist     General bed mobility comments: minA for L LE only. pt able to come to long sit from [facility] flat and bring self to EOB  Transfers Overall transfer level: Needs assistance Equipment used: Rolling walker (2 wheeled) Transfers: Sit to/from Stand Sit to Stand: Min guard         General transfer comment: increased time but safe transfer  Ambulation/Gait Ambulation/Gait assistance: Min guard Ambulation Distance (Feet): 75 Feet (x2) Assistive device: Rolling walker (2 wheeled) Gait Pattern/deviations: Step-through pattern;Step-to pattern;Decreased stride length;Decreased stance time - left;Wide base of support Gait velocity: slow Gait velocity interpretation: Below normal speed for age/gender General Gait Details: emphasis on reciprocal gait pattern leading with L foot   Stairs Stairs: Yes   Stair Management: One rail Left;Step to pattern;Sideways Number of Stairs: 2 General stair  comments: increased time, minA to steady pt during transition of LEs  Wheelchair Mobility    Modified Rankin (Stroke Patients Only)       Balance Overall balance assessment: Needs assistance         Standing balance support: During functional activity Standing balance-Leahy Scale: Fair                              Cognition Arousal/Alertness: Awake/alert Behavior During Therapy: WFL for tasks assessed/performed Overall Cognitive Status: Within Functional Limits for tasks assessed                                        Exercises Total Joint Exercises Quad Sets: AROM;Left;10 reps;Supine Heel Slides: AAROM;Left;10 reps;Supine Long Arc Quad: AROM;Left;10 reps;Supine Marching in Standing: AAROM;Left;10 reps;Standing    General Comments        Pertinent Vitals/Pain Pain Assessment: 0-10 Pain Score: 6  Pain Location: L hip Pain Descriptors / Indicators: Sore Pain Intervention(s): Monitored during session    Home Living                      Prior Function            PT Goals (current goals can now be found in the care plan section) Progress towards PT goals: Progressing toward goals    Frequency    7X/week      PT Plan Current plan remains appropriate    Co-evaluation  AM-PAC PT "6 Clicks" Daily Activity  Outcome Measure  Difficulty turning over in bed (including adjusting bedclothes, sheets and blankets)?: Unable Difficulty moving from lying on back to sitting on the side of the bed? : Unable Difficulty sitting down on and standing up from a chair with arms (e.g., wheelchair, bedside commode, etc,.)?: A Little Help needed moving to and from a bed to chair (including a wheelchair)?: A Little Help needed walking in hospital room?: A Little Help needed climbing 3-5 steps with a railing? : A Lot 6 Click Score: 13    End of Session Equipment Utilized During Treatment: Gait belt Activity Tolerance:  Patient limited by fatigue Patient left: in bed;with call bell/phone within reach Nurse Communication: Mobility status PT Visit Diagnosis: Pain;Difficulty in walking, not elsewhere classified (R26.2) Pain - Right/Left: Left Pain - part of body: Hip     Time: 4098-1191 PT Time Calculation (min) (ACUTE ONLY): 25 min  Charges:  $Gait Training: 8-22 mins $Therapeutic Exercise: 8-22 mins                    G Codes:       Lewis Shock, PT, DPT Pager #: [phone] Office #: [phone]    Jewelene Mairena M Jermany Sundell 12/07/2016, 2:21 PM

## 2016-12-07 NOTE — Evaluation (Signed)
Physical Therapy Evaluation Patient Details Name: Janice Werner MRN: [id number] DOB: Dec 20, 1950 Today's Date: 12/07/2016   History of Present Illness  Pt admitted for elective L direct ant THA. Pt with h/o L TKA and back surgery.  Clinical Impression  Pt is s/p THA resulting in the deficits listed below (see PT Problem List). Pt limited by fatigue. Pt currently unsafe to return home alone. Pt will benefit from skilled PT to increase their independence and safety with mobility to allow discharge to the venue listed below.      Follow Up Recommendations Home health PT;Supervision/Assistance - 24 hour    Equipment Recommendations  None recommended by PT    Recommendations for Other Services       Precautions / Restrictions Precautions Precautions: None Restrictions Weight Bearing Restrictions: Yes LLE Weight Bearing: Weight bearing as tolerated      Mobility  Bed Mobility Overal bed mobility: Needs Assistance Bed Mobility: Supine to Sit     Supine to sit: Min assist     General bed mobility comments: minA for L LE, v/c's to push up on elbows, pt with definite use of bed rail and increased time  Transfers Overall transfer level: Needs assistance Equipment used: Rolling walker (2 wheeled) Transfers: Sit to/from Stand Sit to Stand: Min guard         General transfer comment: v/c's for hand placement, increased time, pt requires use of bathroom rail to pull self up off toliet  Ambulation/Gait Ambulation/Gait assistance: Min guard Ambulation Distance (Feet): 60 Feet Assistive device: Rolling walker (2 wheeled) Gait Pattern/deviations: Step-through pattern;Step-to pattern;Decreased stride length;Decreased stance time - left;Wide base of support Gait velocity: slow Gait velocity interpretation: Below normal speed for age/gender General Gait Details: pt initially with difficulty advancing and clearing L foot but then progressed to step through. pt quickly fatigued  and required 2 standing rest breaks  Stairs            Wheelchair Mobility    Modified Rankin (Stroke Patients Only)       Balance Overall balance assessment: Needs assistance Sitting-balance support: Feet supported;No upper extremity supported Sitting balance-Leahy Scale: Good     Standing balance support: During functional activity Standing balance-Leahy Scale: Good Standing balance comment: pt able to stand at sink and wash hands without difficulty                             Pertinent Vitals/Pain Pain Assessment: 0-10 Pain Score: 4  Pain Location: L hip Pain Descriptors / Indicators: Sore Pain Intervention(s): Monitored during session    Home Living Family/patient expects to be discharged to:: Private residence Living Arrangements: Alone Available Help at Discharge: Family;Available PRN/intermittently Type of Home: House Home Access: Stairs to enter Entrance Stairs-Rails: Can reach both Entrance Stairs-Number of Steps: 5 Home Layout: One level Home Equipment: Walker - 2 wheels;Cane - single point      Prior Function Level of Independence: Independent               Hand Dominance   Dominant Hand: Right    Extremity/Trunk Assessment   Upper Extremity Assessment Upper Extremity Assessment: Overall WFL for tasks assessed    Lower Extremity Assessment Lower Extremity Assessment: LLE deficits/detail LLE Deficits / Details: able to complete quad set and glut set, unable to initiate active hip flexion due to pain    Cervical / Trunk Assessment Cervical / Trunk Assessment: Normal  Communication   Communication:  No difficulties  Cognition Arousal/Alertness: Awake/alert Behavior During Therapy: WFL for tasks assessed/performed Overall Cognitive Status: Within Functional Limits for tasks assessed                                        General Comments General comments (skin integrity, edema, etc.): pt assisted to  bathroom, was able to complete hygiene in sitting    Exercises Total Joint Exercises Quad Sets: AROM;Left;10 reps;Supine Gluteal Sets: AROM;Both;10 reps;Supine Heel Slides: AAROM;Left;10 reps;Supine   Assessment/Plan    PT Assessment Patient needs continued PT services  PT Problem List Decreased strength;Decreased activity tolerance;Decreased balance;Decreased mobility;Decreased knowledge of use of DME;Decreased safety awareness;Pain       PT Treatment Interventions DME instruction;Gait training;Stair training;Functional mobility training;Therapeutic activities;Therapeutic exercise;Balance training    PT Goals (Current goals can be found in the Care Plan section)  Acute Rehab PT Goals Patient Stated Goal: home PT Goal Formulation: With patient Time For Goal Achievement: 12/14/16 Potential to Achieve Goals: Good    Frequency 7X/week   Barriers to discharge Decreased caregiver support unsure if 24/7 avail    Co-evaluation               AM-PAC PT "6 Clicks" Daily Activity  Outcome Measure Difficulty turning over in bed (including adjusting bedclothes, sheets and blankets)?: Unable Difficulty moving from lying on back to sitting on the side of the bed? : Unable Difficulty sitting down on and standing up from a chair with arms (e.g., wheelchair, bedside commode, etc,.)?: A Lot Help needed moving to and from a bed to chair (including a wheelchair)?: A Lot Help needed walking in hospital room?: A Lot Help needed climbing 3-5 steps with a railing? : A Lot 6 Click Score: 10    End of Session Equipment Utilized During Treatment: Gait belt Activity Tolerance: Patient limited by fatigue Patient left: in chair;with call bell/phone within reach Nurse Communication: Mobility status PT Visit Diagnosis: Pain;Difficulty in walking, not elsewhere classified (R26.2) Pain - Right/Left: Left Pain - part of body: Hip    Time: 0828-0900 PT Time Calculation (min) (ACUTE ONLY): 32  min   Charges:   PT Evaluation $PT Eval Moderate Complexity: 1 Mod PT Treatments $Gait Training: 8-22 mins   PT G Codes:        Janice Werner, PT, DPT Pager #: [phone] Office #: [phone]   Janice Werner Janice Werner 12/07/2016, 9:30 AM

## 2016-12-07 NOTE — Progress Notes (Signed)
   Assessment / Plan: 1 Day Post-Op  S/P Procedure(s) (LRB): LEFT TOTAL HIP ARTHROPLASTY ANTERIOR APPROACH (Left) by Dr. Jewel Baize. Murphy on 12/06/16  Principal Problem:   Primary osteoarthritis of left hip  Not yet OOB.  Pain improved as compared to before surgery.    Up with therapy Incentive Spirometry Elevate and apply ice  Weight Bearing: Weight Bearing as Tolerated (WBAT)  Dressings: PRN.  VTE prophylaxis: Aspirin, SCDs, ambulation Dispo: Home as soon as later today pending PT evaluation  Subjective: Patient reports pain as mild.  Tolerating diet.  Urinating.   No CP, SOB.  Not yet OOB.  Objective:   VITALS:   Vitals:   12/06/16 1830 12/06/16 2026 12/07/16 0030 12/07/16 0432  BP:  (!) 145/74 (!) 108/50 (!) 110/50  Pulse: 78 88 82 76  Resp: Temp:  98.1 F (36.7 C) 98.7 F (37.1 C) 98.3 F (36.8 C)  TempSrc:  Oral Oral Oral  SpO2: 97% 98% 98% 97%   CBC Latest Ref Rng & Units 11/25/2016  WBC 4.0 - 10.5 K/uL 6.5  Hemoglobin 12.0 - 15.0 g/dL 16.1  Hematocrit 09.6 - 46.0 % 42.7  Platelets 150 - 400 K/uL 264   BMP Latest Ref Rng & Units 11/25/2016  Glucose 65 - 99 mg/dL 93  BUN 6 - 20 mg/dL 18  Creatinine 0.45 - 4.09 mg/dL 8.11  Sodium 914 - 782 mmol/L 142  Potassium 3.5 - 5.1 mmol/L 3.8  Chloride 101 - 111 mmol/L 109  CO2 22 - 32 mmol/L 24  Calcium 8.9 - 10.3 mg/dL 9.0   Intake/Output      09/18 0701 - 09/19 0700 09/19 0701 - 09/20 0700   P.O. 480    I.V. 1558.3    IV Piggyback 200    Total Intake 2238.3     Urine 1000    Blood 100    Total Output 1100     Net +1138.3             Physical Exam: General: NAD.  Upright in bed.  Pleasant, conversant.  No increased WOB MSK Neurovascularly intact Sensation intact distally Feet warm Dorsiflexion/Plantar flexion intact Incision: dressing C/D/I   Albina Billet III, PA-C 12/07/2016, 7:25 AM

## 2016-12-07 NOTE — Evaluation (Signed)
Occupational Therapy Evaluation Patient Details Name: Janice Werner MRN: [id number] DOB: 1950-10-05 Today's Date: 12/07/2016    History of Present Illness Pt admitted for elective L direct ant THA. Pt with h/o L TKA and back surgery.   Clinical Impression   PATIENT WAS EDUCATED ON USE OF AE TO INCREASE I WITH LE ADLS. PNT WAS ABLE TO RETURN DEMONSTATE WITH MIN CUES. PATIENT WOULD LIKE ANOTHER VISIT FROM OT IF WHE STAYS IN HOSPITAL TO REINFORCE TRAINING. PATIENT NEEDS FURTHER TRAINING ON TRANSFERS TO COMMODE AND SHOWER. ACUTE OT TO FOLLOW.     Follow Up Recommendations  Home health OT    Equipment Recommendations       Recommendations for Other Services       Precautions / Restrictions Precautions Precautions: None Restrictions Weight Bearing Restrictions: Yes LLE Weight Bearing: Weight bearing as tolerated      Mobility Bed Mobility Overal bed mobility: Needs Assistance Bed Mobility: Supine to Sit     Supine to sit: Min assist     General bed mobility comments: minA for L LE, v/c's to push up on elbows, pt with definite use of bed rail and increased time  Transfers Overall transfer level: Needs assistance Equipment used: Rolling walker (2 wheeled) Transfers: Sit to/from Stand Sit to Stand: Min guard         General transfer comment: v/c's for hand placement, increased time, pt requires use of bathroom rail to pull self up off toliet    Balance Overall balance assessment: Needs assistance Sitting-balance support: Feet supported;No upper extremity supported Sitting balance-Leahy Scale: Good     Standing balance support: During functional activity Standing balance-Leahy Scale: Good Standing balance comment: pt able to stand at sink and wash hands without difficulty                           ADL either performed or assessed with clinical judgement   ADL Overall ADL's : Needs assistance/impaired Eating/Feeding: Independent   Grooming:  Wash/dry hands;Wash/dry face;Supervision/safety   Upper Body Bathing: Set up   Lower Body Bathing: Minimal assistance;Moderate assistance   Upper Body Dressing : Set up   Lower Body Dressing: Minimal assistance;With adaptive equipment   Toilet Transfer: Minimal assistance;Comfort height toilet;Grab bars   Toileting- Clothing Manipulation and Hygiene: Supervision/safety       Functional mobility during ADLs: Minimal assistance;Rolling walker       Vision Baseline Vision/History: Wears glasses Wears Glasses: At all times Patient Visual Report: No change from baseline       Perception     Praxis      Pertinent Vitals/Pain Pain Assessment: 0-10 Pain Score: 4  Pain Location: L hip Pain Descriptors / Indicators: Sore Pain Intervention(s): Monitored during session     Hand Dominance Right   Extremity/Trunk Assessment Upper Extremity Assessment Upper Extremity Assessment: Overall WFL for tasks assessed   Lower Extremity Assessment Lower Extremity Assessment: LLE deficits/detail LLE Deficits / Details: able to complete quad set and glut set, unable to initiate active hip flexion due to pain   Cervical / Trunk Assessment Cervical / Trunk Assessment: Normal   Communication Communication Communication: No difficulties   Cognition Arousal/Alertness: Awake/alert Behavior During Therapy: WFL for tasks assessed/performed Overall Cognitive Status: Within Functional Limits for tasks assessed  General Comments  pt assisted to bathroom, was able to complete hygiene in sitting    Exercises Exercises: Total Joint Total Joint Exercises Quad Sets: AROM;Left;10 reps;Supine Gluteal Sets: AROM;Both;10 reps;Supine Heel Slides: AAROM;Left;10 reps;Supine   Shoulder Instructions      Home Living Family/patient expects to be discharged to:: Private residence Living Arrangements: Alone Available Help at Discharge:  Family;Available PRN/intermittently Type of Home: House Home Access: Stairs to enter [employer] of Steps: 5 Entrance Stairs-Rails: Can reach both Home Layout: One level     Bathroom Shower/Tub: Producer, television/film/video: Handicapped height     Home Equipment: Environmental consultant - 2 wheels;Cane - single point          Prior Functioning/Environment Level of Independence: Independent                 OT Problem List: Decreased activity tolerance;Decreased knowledge of use of DME or AE      OT Treatment/Interventions: Self-care/ADL training;DME and/or AE instruction;Therapeutic activities;Patient/family education    OT Goals(Current goals can be found in the care plan section) Acute Rehab OT Goals Patient Stated Goal: home OT Goal Formulation: With patient Time For Goal Achievement: 12/14/16 Potential to Achieve Goals: Good  OT Frequency: Min 2X/week   Barriers to D/C: Decreased caregiver support          Co-evaluation              AM-PAC PT "6 Clicks" Daily Activity     Outcome Measure Help from another person eating meals?: None Help from another person taking care of personal grooming?: A Little Help from another person toileting, which includes using toliet, bedpan, or urinal?: A Little Help from another person bathing (including washing, rinsing, drying)?: A Little Help from another person to put on and taking off regular upper body clothing?: None Help from another person to put on and taking off regular lower body clothing?: A Little 6 Click Score: 20   End of Session    Activity Tolerance: Patient tolerated treatment well Patient left: in chair;with call bell/phone within reach  OT Visit Diagnosis: Other abnormalities of gait and mobility (R26.89)                Time: 0454-0981 OT Time Calculation (min): 31 min Charges:  OT General Charges $OT Visit: 1 Visit OT Evaluation $OT Eval Low Complexity: 1 Low OT Treatments $Self Care/Home  Management : 8-22 mins G-Codes:    6 CLICKS  Cagney Degrace 12/07/2016, 9:35 AM

## 2016-12-07 NOTE — Discharge Summary (Addendum)
Discharge Summary  Patient ID: Janice Werner MRN: [id number] DOB/AGE: Sep 06, 1950 66 y.o.  Admit date: 12/06/2016 Discharge date: 12/07/2016  Admission Diagnoses:  Primary osteoarthritis of left hip  Discharge Diagnoses:  Principal Problem:   Primary osteoarthritis of left hip Active Problems:   Essential hypertension   Past Medical History:  Diagnosis Date  . Arthritis   . Headache    migraines  . Hypertension     Surgeries: Procedure(s): LEFT TOTAL HIP ARTHROPLASTY ANTERIOR APPROACH on 12/06/2016   Consultants (if any):   Discharged Condition: Improved  Hospital Course: Janice Werner is an 66 y.o. female who was admitted 12/06/2016 with a diagnosis of Primary osteoarthritis of left hip and went to the operating room on 12/06/2016 and underwent the above named procedures.    She was given perioperative antibiotics:  Anti-infectives    Start     Dose/Rate Route Frequency Ordered Stop   12/06/16 2000  ceFAZolin (ANCEF) IVPB 2g/100 mL premix     2 g 200 mL/hr over 30 Minutes Intravenous Every 6 hours 12/06/16 1758 12/07/16 0230   12/06/16 1230  ceFAZolin (ANCEF) IVPB 2g/100 mL premix     2 g 200 mL/hr over 30 Minutes Intravenous To [facility] 12/05/16 1448 12/06/16 1404    .  She was given sequential compression devices, early ambulation, and ASA 325 mg for DVT prophylaxis.  She benefited maximally from the hospital stay and there were no complications.    Recent vital signs:  Vitals:   12/07/16 0030 12/07/16 0432  BP: (!) 108/50 (!) 110/50  Pulse: 82 76  Resp: 16 17  Temp: 98.7 F (37.1 C) 98.3 F (36.8 C)  SpO2: 98% 97%    Recent laboratory studies:  Lab Results  Component Value Date   HGB 13.7 11/25/2016   Lab Results  Component Value Date   WBC 6.5 11/25/2016   PLT 264 11/25/2016   No results found for: INR Lab Results  Component Value Date   NA 142 11/25/2016   K 3.8 11/25/2016   CL 109 11/25/2016   CO2 24 11/25/2016   BUN  18 11/25/2016   CREATININE 0.83 11/25/2016   GLUCOSE 93 11/25/2016    Discharge Medications:   Allergies as of 12/07/2016      Reactions   Codeine Nausea Only, Other (See Comments)   GI Upset (intolerance) Severe stabbing stomach pain   Triazolam Other (See Comments)   Mental Status Changes (intolerance) hallucinations      Medication List    STOP taking these medications   acetaminophen 500 MG tablet Commonly known as:  TYLENOL     TAKE these medications   amLODipine 5 MG tablet Commonly known as:  NORVASC Take 5 mg by mouth daily.   aspirin EC 325 MG tablet Take 1 tablet (325 mg total) by mouth daily. For 30 days post op for DVT Prophylaxis   celecoxib 200 MG capsule Commonly known as:  CELEBREX Take 1 capsule (200 mg total) by mouth 2 (two) times daily. For 2 weeks post op.  Discontinue Ibuprofen when taking this medicine.   docusate sodium 100 MG capsule Commonly known as:  COLACE Take 1 capsule (100 mg total) by mouth 2 (two) times daily. To prevent constipation while taking pain medication.   HYDROcodone-acetaminophen 5-325 MG tablet Commonly known as:  NORCO Take 1-2 tablets by mouth every 4 (four) hours as needed for moderate pain.   losartan 25 MG tablet Commonly known as:  COZAAR Take 25  mg by mouth at bedtime.   lovastatin 10 MG tablet Commonly known as:  MEVACOR Take 10 mg by mouth at bedtime.   methocarbamol 500 MG tablet Commonly known as:  ROBAXIN Take 1 tablet (500 mg total) by mouth every 6 (six) hours as needed for muscle spasms.   omeprazole 20 MG capsule Commonly known as:  PRILOSEC Take 1 capsule (20 mg total) by mouth daily. 30 days for gastroprotection while taking Aspirin.   ondansetron 4 MG tablet Commonly known as:  ZOFRAN Take 1 tablet (4 mg total) by mouth every 8 (eight) hours as needed for nausea or vomiting.            Discharge Care Instructions        Start     Ordered   12/06/16 0000  aspirin EC 325 MG tablet   Daily     12/06/16 1613   12/06/16 0000  celecoxib (CELEBREX) 200 MG capsule  2 times daily     12/06/16 1613   12/06/16 0000  docusate sodium (COLACE) 100 MG capsule  2 times daily     12/06/16 1613   12/06/16 0000  omeprazole (PRILOSEC) 20 MG capsule  Daily     12/06/16 1613   12/06/16 0000  HYDROcodone-acetaminophen (NORCO) 5-325 MG tablet  Every 4 hours PRN     12/06/16 1613   12/06/16 0000  methocarbamol (ROBAXIN) 500 MG tablet  Every 6 hours PRN     12/06/16 1613   12/06/16 0000  ondansetron (ZOFRAN) 4 MG tablet  Every 8 hours PRN     12/06/16 1613      Diagnostic Studies: Dg C-arm 1-60 Min  Result Date: 12/06/2016 CLINICAL DATA:  Left hip placement EXAM: DG C-ARM 61-120 MIN; OPERATIVE LEFT HIP WITH PELVIS COMPARISON:  None. FINDINGS: Changes of left hip replacement. Normal AP alignment. No hardware or bony complicating feature. IMPRESSION: Left hip replacement without visible complicating feature. Electronically Signed   By: Charlett Nose M.D.   On: 12/06/2016 15:36   Dg Hip Port Unilat With Pelvis 1v Left  Result Date: 12/06/2016 CLINICAL DATA:  Left total hip arthroplasty. EXAM: DG HIP (WITH OR WITHOUT PELVIS) 1V PORT LEFT COMPARISON:  Left hip x-rays from same day. FINDINGS: Postsurgical changes related to left total hip arthroplasty. Components are well aligned. No evidence of periprosthetic fracture or loosening. Expected postsurgical changes including subcutaneous emphysema are noted. Moderate degenerative changes of the right hip joint. IMPRESSION: Postsurgical changes related to left total hip arthroplasty without evidence of acute postoperative complication. Electronically Signed   By: Obie Dredge M.D.   On: 12/06/2016 16:54   Dg Hip Operative Unilat With Pelvis Left  Result Date: 12/06/2016 CLINICAL DATA:  Left hip placement EXAM: DG C-ARM 61-120 MIN; OPERATIVE LEFT HIP WITH PELVIS COMPARISON:  None. FINDINGS: Changes of left hip replacement. Normal AP alignment. No  hardware or bony complicating feature. IMPRESSION: Left hip replacement without visible complicating feature. Electronically Signed   By: Charlett Nose M.D.   On: 12/06/2016 15:36    Disposition: Home w/ HHPT.   Follow-up Information    Sheral Apley, MD Follow up.   Specialty:  Orthopedic Surgery Contact information: [address] [place] [address] [phone]            Signed: Albina Billet III PA-C 12/07/2016, 7:33 AM

## 2016-12-08 NOTE — Progress Notes (Signed)
Pt ready for d/c per MD. Pt met PT/OT goals, equipment was delivered to pt's hospital room. Discharge instructions and prescriptions reviewed with pt and daughter, all questions answered.   Max, Jerry Caras

## 2016-12-08 NOTE — Progress Notes (Signed)
Physical Therapy Treatment Patient Details Name: Janice Werner MRN: [id number] DOB: 1950/04/08 Today's Date: 12/08/2016    History of Present Illness Pt admitted for elective L direct ant THA. Pt with h/o L TKA and back surgery.    PT Comments    Pt performed increased gait and reviewed stair training in prep for d/c home.  HEP issued and reviewed in its entirety to patient.  Plan for d/c home with support from family.  Nursing informed patient ready to d/c home from a mobility stand point.     Follow Up Recommendations  Home health PT;Supervision/Assistance - 24 hour     Equipment Recommendations  None recommended by PT    Recommendations for Other Services       Precautions / Restrictions Precautions Precautions: None Restrictions Weight Bearing Restrictions: Yes LLE Weight Bearing: Weight bearing as tolerated    Mobility  Bed Mobility               General bed mobility comments: Pt in recliner on arrival.    Transfers Overall transfer level: Needs assistance Equipment used: Rolling walker (2 wheeled) Transfers: Sit to/from Stand Sit to Stand: Supervision         General transfer comment: Cues for hand placement to push from seated surface and reach back for seated surface.  Pt performed from chair with and without arm rests.    Ambulation/Gait Ambulation/Gait assistance: Supervision Ambulation Distance (Feet): 100 Feet (x2) Assistive device: Rolling walker (2 wheeled) Gait Pattern/deviations: Step-through pattern;Decreased stride length;Decreased stance time - left;Wide base of support;Step-to pattern Gait velocity: slow Gait velocity interpretation: Below normal speed for age/gender General Gait Details: Pt stiff on initial steps but improved reciprocal pattern with good symmetry on 2nd trial.  Pt performed increased activity this pm.   Cues for RW safety and sequencing.       Stairs Stairs: Yes  Supervision Stair Management: One rail  Left;Step to pattern;Sideways Number of Stairs: 6 General stair comments: Cues for sequencing and RW placement.  Pt tolerated well but fatigued post session.  Wheelchair Mobility    Modified Rankin (Stroke Patients Only)       Balance Overall balance assessment: Needs assistance   Sitting balance-Leahy Scale: Good       Standing balance-Leahy Scale: Fair                              Cognition Arousal/Alertness: Awake/alert Behavior During Therapy: WFL for tasks assessed/performed Overall Cognitive Status: Within Functional Limits for tasks assessed                                        Exercises Total Joint Exercises Ankle Circles/Pumps: AROM;Both;20 reps;Supine Quad Sets: AROM;Left;10 reps;Supine Short Arc Quad: AROM;Left;10 reps;Supine Heel Slides: AROM;Left;10 reps;Supine Hip ABduction/ADduction: AROM;Left;Supine;Standing;20 reps (1x10 standing and 1x10 supine. ) Long Arc Quad: AROM;Left;10 reps;Seated Knee Flexion: AROM;Left;10 reps;Standing Marching in Standing: AROM;Left;10 reps;Standing Standing Hip Extension: AROM;Left;10 reps;Standing    General Comments        Pertinent Vitals/Pain Pain Assessment: 0-10 Pain Score: 3  Pain Location: L hip Pain Descriptors / Indicators: Sore Pain Intervention(s): Monitored during session;Repositioned    Home Living                      Prior Function  PT Goals (current goals can now be found in the care plan section) Acute Rehab PT Goals Patient Stated Goal: home Potential to Achieve Goals: Good Progress towards PT goals: Progressing toward goals    Frequency    7X/week      PT Plan Current plan remains appropriate    Co-evaluation              AM-PAC PT "6 Clicks" Daily Activity  Outcome Measure  Difficulty turning over in bed (including adjusting bedclothes, sheets and blankets)?: Unable Difficulty moving from lying on back to sitting on the  side of the bed? : Unable Difficulty sitting down on and standing up from a chair with arms (e.g., wheelchair, bedside commode, etc,.)?: A Little Help needed moving to and from a bed to chair (including a wheelchair)?: A Little Help needed walking in hospital room?: A Little Help needed climbing 3-5 steps with a railing? : A Little 6 Click Score: 14    End of Session Equipment Utilized During Treatment: Gait belt Activity Tolerance: Patient limited by fatigue Patient left: in bed;with call bell/phone within reach Nurse Communication: Mobility status PT Visit Diagnosis: Pain;Difficulty in walking, not elsewhere classified (R26.2) Pain - Right/Left: Left Pain - part of body: Hip     Time: 5573-2202 PT Time Calculation (min) (ACUTE ONLY): 27 min  Charges:  $Gait Training: 8-22 mins $Therapeutic Exercise: 8-22 mins                    G Codes:       Joycelyn Rua, PTA pager [phone]    Florestine Avers 12/08/2016, 10:19 AM

## 2016-12-08 NOTE — Progress Notes (Signed)
   Assessment / Plan: 2 Days Post-Op  S/P Procedure(s) (LRB): LEFT TOTAL HIP ARTHROPLASTY ANTERIOR APPROACH (Left) by Dr. Jewel Baize. Murphy on 12/06/16  Principal Problem:   Primary osteoarthritis of left hip Active Problems:   Essential hypertension  Mobilizing improved.  Pain improved as compared to before surgery.  Patient ready for discharge today.  Up with therapy Incentive Spirometry Elevate and apply ice  Weight Bearing: Weight Bearing as Tolerated (WBAT)  Dressings: PRN.  VTE prophylaxis: Aspirin, SCDs, ambulation Dispo: Home today  Subjective: Patient reports pain as mild.  Tolerating diet.  Urinating.   No CP, SOB.  OOB in hall.  Objective:   VITALS:   Vitals:   12/07/16 0432 12/07/16 1500 12/07/16 2050 12/08/16 0500  BP: (!) 110/50 (!) 115/58 114/71 138/82  Pulse: 76 75 73 83  Resp: Temp: 98.3 F (36.8 C) 98.9 F (37.2 C) 98.3 F (36.8 C) 98 F (36.7 C)  TempSrc: Oral Oral Oral Oral  SpO2: 97% 96% 96% 96%   CBC Latest Ref Rng & Units 11/25/2016  WBC 4.0 - 10.5 K/uL 6.5  Hemoglobin 12.0 - 15.0 g/dL 60.4  Hematocrit 54.0 - 46.0 % 42.7  Platelets 150 - 400 K/uL 264   BMP Latest Ref Rng & Units 11/25/2016  Glucose 65 - 99 mg/dL 93  BUN 6 - 20 mg/dL 18  Creatinine 9.81 - 1.91 mg/dL 4.78  Sodium 295 - 621 mmol/L 142  Potassium 3.5 - 5.1 mmol/L 3.8  Chloride 101 - 111 mmol/L 109  CO2 22 - 32 mmol/L 24  Calcium 8.9 - 10.3 mg/dL 9.0   Intake/Output      09/19 0701 - 09/20 0700 09/20 0701 - 09/21 0700   P.O. 968    Total Intake 968     Net +968          Urine Occurrence 5 x       Physical Exam: General: NAD.  Upright in bed.  Pleasant, conversant.  No increased WOB MSK Neurovascularly intact Sensation intact distally Feet warm Dorsiflexion/Plantar flexion intact Incision: dressing C/D/I   Albina Billet III, PA-C 12/08/2016, 7:28 AM

## 2016-12-08 NOTE — Progress Notes (Signed)
Occupational Therapy Treatment Patient Details Name: Janice Werner MRN: [id number] DOB: Mar 22, 1950 Today's Date: 12/08/2016    History of present illness Pt admitted for elective L direct ant THA. Pt with h/o L TKA and back surgery.   OT comments  Pt able to perform LB dressing and functional mobility with supervision today. Pt utilizing compensatory strategies for ADL without questions or concerns. Updated d/c plan to no OT follow up. Will continue to follow acutely.   Follow Up Recommendations  No OT follow up    Equipment Recommendations  3 in 1 bedside commode    Recommendations for Other Services      Precautions / Restrictions Precautions Precautions: None Restrictions Weight Bearing Restrictions: Yes LLE Weight Bearing: Weight bearing as tolerated       Mobility Bed Mobility               General bed mobility comments: Pt OOB in chair upon arrival  Transfers Overall transfer level: Needs assistance Equipment used: Rolling walker (2 wheeled) Transfers: Sit to/from Stand Sit to Stand: Supervision         General transfer comment: for safety, good hand placement and technique    Balance Overall balance assessment: Needs assistance Sitting-balance support: Feet supported;No upper extremity supported Sitting balance-Leahy Scale: Good     Standing balance support: No upper extremity supported;During functional activity Standing balance-Leahy Scale: Fair Standing balance comment: for static standing                           ADL either performed or assessed with clinical judgement   ADL Overall ADL's : Needs assistance/impaired                 Upper Body Dressing : Set up;Standing Upper Body Dressing Details (indicate cue type and reason): to don bra and shirt Lower Body Dressing: Set up;Supervision/safety;Sit to/from stand Lower Body Dressing Details (indicate cue type and reason): for underwear, pants and shoes. Pt with good  technique utilizing compensatory strategies Toilet Transfer: Supervision/safety;Ambulation;RW Toilet Transfer Details (indicate cue type and reason): simulated by sit to stand from chair with functional mobility in room. educated on use of 3 in 1 over toilet       Tub/Shower Transfer Details (indicate cue type and reason): Pt planning to sponge bathe initially Functional mobility during ADLs: Supervision/safety;Rolling walker       Vision       Perception     Praxis      Cognition Arousal/Alertness: Awake/alert Behavior During Therapy: WFL for tasks assessed/performed Overall Cognitive Status: Within Functional Limits for tasks assessed                                          Exercises    Shoulder Instructions       General Comments      Pertinent Vitals/ Pain       Pain Assessment: Faces Pain Score: 3  Faces Pain Scale: Hurts a little bit Pain Location: L hip Pain Descriptors / Indicators: Sore Pain Intervention(s): Monitored during session;Ice applied  Home Living                                          Prior Functioning/Environment  Frequency  Min 2X/week        Progress Toward Goals  OT Goals(current goals can now be found in the care plan section)  Progress towards OT goals: Progressing toward goals  Acute Rehab OT Goals Patient Stated Goal: home today OT Goal Formulation: With patient ADL Goals Pt Will Perform Lower Body Bathing: with modified independence;sit to/from stand Pt Will Perform Lower Body Dressing: with modified independence;sit to/from stand Pt Will Transfer to Toilet: with supervision;ambulating;bedside commode Pt Will Perform Toileting - Clothing Manipulation and hygiene: with supervision;sit to/from stand Pt Will Perform Tub/Shower Transfer: Shower transfer;with supervision;ambulating;3 in 1;rolling walker  Plan Discharge plan needs to be updated    Co-evaluation                  AM-PAC PT "6 Clicks" Daily Activity     Outcome Measure   Help from another person eating meals?: None Help from another person taking care of personal grooming?: A Little Help from another person toileting, which includes using toliet, bedpan, or urinal?: A Little Help from another person bathing (including washing, rinsing, drying)?: A Little Help from another person to put on and taking off regular upper body clothing?: None Help from another person to put on and taking off regular lower body clothing?: A Little 6 Click Score: 20    End of Session Equipment Utilized During Treatment: Rolling walker  OT Visit Diagnosis: Other abnormalities of gait and mobility (R26.89)   Activity Tolerance Patient tolerated treatment well   Patient Left in chair;with call bell/phone within reach   Nurse Communication  (RN tech-pt dressed and ready for d/c)        Time: 1610-9604 OT Time Calculation (min): 18 min  Charges: OT General Charges $OT Visit: 1 Visit OT Treatments $Self Care/Home Management : 8-22 mins  Savon Bordonaro A. Brett Albino, M.S., OTR/L Pager: [phone]   Gaye Alken 12/08/2016, 11:02 AM

## 2017-04-13 ENCOUNTER — Emergency Department (HOSPITAL_COMMUNITY)
Admission: EM | Admit: 2017-04-13 | Discharge: 2017-04-13 | Disposition: A | Discharge status: Home / Self Care | Payer: Medicare HMO | Attending: Emergency Medicine | Admitting: Emergency Medicine

## 2017-04-13 ENCOUNTER — Emergency Department (HOSPITAL_COMMUNITY): Payer: Medicare HMO

## 2017-04-13 ENCOUNTER — Encounter (HOSPITAL_COMMUNITY): Payer: Self-pay | Admitting: Emergency Medicine

## 2017-04-13 ENCOUNTER — Other Ambulatory Visit: Payer: Self-pay

## 2017-04-13 DIAGNOSIS — Z96652 Presence of left artificial knee joint: Secondary | ICD-10-CM | POA: Insufficient documentation

## 2017-04-13 DIAGNOSIS — S6992XA Unspecified injury of left wrist, hand and finger(s), initial encounter: Secondary | ICD-10-CM | POA: Insufficient documentation

## 2017-04-13 DIAGNOSIS — Y999 Unspecified external cause status: Secondary | ICD-10-CM | POA: Insufficient documentation

## 2017-04-13 DIAGNOSIS — I10 Essential (primary) hypertension: Secondary | ICD-10-CM | POA: Diagnosis not present

## 2017-04-13 DIAGNOSIS — Z7982 Long term (current) use of aspirin: Secondary | ICD-10-CM | POA: Insufficient documentation

## 2017-04-13 DIAGNOSIS — Y929 Unspecified place or not applicable: Secondary | ICD-10-CM | POA: Insufficient documentation

## 2017-04-13 DIAGNOSIS — Y939 Activity, unspecified: Secondary | ICD-10-CM | POA: Diagnosis not present

## 2017-04-13 DIAGNOSIS — Z96642 Presence of left artificial hip joint: Secondary | ICD-10-CM | POA: Insufficient documentation

## 2017-04-13 DIAGNOSIS — W19XXXA Unspecified fall, initial encounter: Secondary | ICD-10-CM | POA: Diagnosis not present

## 2017-04-13 DIAGNOSIS — Z79899 Other long term (current) drug therapy: Secondary | ICD-10-CM | POA: Insufficient documentation

## 2017-04-13 DIAGNOSIS — M25532 Pain in left wrist: Secondary | ICD-10-CM

## 2017-04-13 MED ORDER — TRAMADOL HCL 50 MG PO TABS: 50.0000 mg | ORAL_TABLET | Freq: Four times a day (QID) | ORAL | 0 refills | Status: DC | PRN

## 2017-04-13 NOTE — ED Triage Notes (Signed)
Pt suffered a fall yesterday. Left wrist swollen and painful. Pt able to mve fingers, radial pulse present.

## 2017-04-13 NOTE — ED Provider Notes (Signed)
[facility] EMERGENCY DEPARTMENT Provider Note   CSN: [id number] Arrival date & time: 04/13/17  1424     History   Chief Complaint Chief Complaint  Patient presents with  . Fall  . Wrist Pain    HPI Janice Werner is a 67 y.o. female.  Patient stumbled in the yard yesterday while picking up debris. She fell onto her knees and onto her left hand. She is complaining of pain to the left wrist. She has a history of left knee replacement and left hip replacement, but does not have any pain or discomfort in those areas.   The history is provided by the patient. No language interpreter was used.  Fall  The current episode started yesterday. The problem has been gradually worsening.  Wrist Pain     Past Medical History:  Diagnosis Date  . Arthritis   . Headache    migraines  . Hypertension     Patient Active Problem List   Diagnosis Date Noted  . Essential hypertension 12/07/2016  . Primary osteoarthritis of left hip 11/07/2016    Past Surgical History:  Procedure Laterality Date  . ABDOMINAL HYSTERECTOMY    . BACK SURGERY     lumbar disc surgery fracture  . bone spur Left    foot  . CHOLECYSTECTOMY    . DILATION AND CURETTAGE OF UTERUS    . JOINT REPLACEMENT Left    Knee replacement  . LEFT OOPHORECTOMY    . TOTAL HIP ARTHROPLASTY Left 12/06/2017  . TOTAL HIP ARTHROPLASTY Left 12/06/2016   Procedure: LEFT TOTAL HIP ARTHROPLASTY ANTERIOR APPROACH;  Surgeon: Sheral Apley, MD;  Location: [facility] OR;  Service: Orthopedics;  Laterality: Left;  . TUBAL LIGATION      OB History    No data available       Home Medications    Prior to Admission medications   Medication Sig Start Date End Date Taking? Authorizing Provider  amLODipine (NORVASC) 5 MG tablet Take 5 mg by mouth daily.    [provider]  aspirin EC 325 MG tablet Take 1 tablet (325 mg total) by mouth daily. For 30 days post op for DVT Prophylaxis 12/06/16   Albina Billet III, PA-C  celecoxib (CELEBREX) 200 MG capsule Take 1 capsule (200 mg total) by mouth 2 (two) times daily. For 2 weeks post op.  Discontinue Ibuprofen when taking this medicine. 12/06/16 12/06/17  Albina Billet III, PA-C  docusate sodium (COLACE) 100 MG capsule Take 1 capsule (100 mg total) by mouth 2 (two) times daily. To prevent constipation while taking pain medication. 12/06/16   Albina Billet III, PA-C  HYDROcodone-acetaminophen (NORCO) 5-325 MG tablet Take 1-2 tablets by mouth every 4 (four) hours as needed for moderate pain. 12/06/16   Albina Billet III, PA-C  losartan (COZAAR) 25 MG tablet Take 25 mg by mouth at bedtime.    [provider]  lovastatin (MEVACOR) 10 MG tablet Take 10 mg by mouth at bedtime.    [provider]  methocarbamol (ROBAXIN) 500 MG tablet Take 1 tablet (500 mg total) by mouth every 6 (six) hours as needed for muscle spasms. 12/06/16   Albina Billet III, PA-C  omeprazole (PRILOSEC) 20 MG capsule Take 1 capsule (20 mg total) by mouth daily. 30 days for gastroprotection while taking Aspirin. 12/06/16   Albina Billet III, PA-C  ondansetron (ZOFRAN) 4 MG tablet Take 1 tablet (4 mg total) by mouth every  8 (eight) hours as needed for nausea or vomiting. 12/06/16   Albina BilletMartensen, Henry Calvin III, PA-C    Family History Family History  Problem Relation Age of Onset  . Hypertension Mother   . Hypertension Father     Social History Social History   Tobacco Use  . Smoking status: Never Smoker  . Smokeless tobacco: Never Used  Substance Use Topics  . Alcohol use: No  . Drug use: No     Allergies   Codeine and Triazolam   Review of Systems Review of Systems  Musculoskeletal: Positive for arthralgias.  All other systems reviewed and are negative.    Physical Exam Updated Vital Signs BP (!) 163/95 (BP Location: Right Arm)   Pulse 91   Temp 98.4 F (36.9 C) (Oral)   Resp 18   Ht 5\' 6"   (1.676 m)   Wt 108.9 kg (240 lb)   SpO2 95%   BMI 38.74 kg/m   Physical Exam  Constitutional: She is oriented to person, place, and time. She appears well-developed and well-nourished.  HENT:  Head: Atraumatic.  Eyes: Conjunctivae are normal.  Neck: Neck supple.  Cardiovascular: Normal rate and regular rhythm.  Pulmonary/Chest: Effort normal and breath sounds normal.  Abdominal: Soft. Bowel sounds are normal.  Musculoskeletal: She exhibits edema and tenderness. She exhibits no deformity.       Left wrist: She exhibits decreased range of motion, tenderness and swelling. She exhibits no crepitus and no deformity.  Lymphadenopathy:    She has no cervical adenopathy.  Neurological: She is alert and oriented to person, place, and time.  Skin: Skin is warm and dry.  Psychiatric: She has a normal mood and affect.  Nursing note and vitals reviewed.    ED Treatments / Results  Labs (all labs ordered are listed, but only abnormal results are displayed) Labs Reviewed - No data to display  EKG  EKG Interpretation None       Radiology Dg Wrist Complete Left  Result Date: 04/13/2017 CLINICAL DATA:  Generalized wrist pain after fall last evening. History of gout. EXAM: LEFT WRIST - COMPLETE 3+ VIEW COMPARISON:  None. FINDINGS: Mid carpal and radiocarpal joint space narrowing with subchondral cystic change of the distal radius. Soft tissue calcifications are noted adjacent to the styloid of the radius. There is soft tissue swelling of the hand and wrist. No acute fractures identified. There is chondrocalcinosis. IMPRESSION: Soft tissue swelling of the dorsum of the hand and wrist. No acute displaced appearing fracture. 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 Electronically Signed   By: Tollie Ethavid  Kwon M.D.    On: 04/13/2017 17:41    Procedures Procedures (including critical care time)  Medications Ordered in ED Medications - No data to display   Initial Impression / Assessment and Plan / ED Course  I have reviewed the triage vital signs and the nursing notes.  Pertinent labs & imaging results that were available during my care of the patient were reviewed by me and considered in my medical decision making (see chart for details).     MDM  Patient discussed with Dr. Donnald GarrePfeiffer.   Patient X-Ray negative for obvious fracture or dislocation.  Pt advised to follow up with orthopedics. Patient given wrist splint while in ED, conservative therapy recommended and discussed. Patient will be discharged home & is agreeable with above  plan. Returns precautions discussed. Pt appears safe for discharge.  Final Clinical Impressions(s) / ED Diagnoses   Final diagnoses:  Left wrist pain    ED Discharge Orders        Ordered    traMADol (ULTRAM) 50 MG tablet  Every 6 hours PRN     04/13/17 1829     ext   Felicie Morn, NP 04/13/17 1830    Arby Barrette, MD 04/18/17 1734

## 2017-04-13 NOTE — ED Notes (Signed)
Pt verbalized understanding discharge instructions and denies any further needs or questions at this time. VS stable, ambulatory and steady gait.   

## 2017-09-04 ENCOUNTER — Encounter (HOSPITAL_COMMUNITY): Payer: Self-pay | Admitting: Emergency Medicine

## 2017-09-04 ENCOUNTER — Ambulatory Visit (HOSPITAL_COMMUNITY)
Admission: EM | Admit: 2017-09-04 | Discharge: 2017-09-04 | Disposition: A | Discharge status: Home / Self Care | Payer: Medicare HMO | Attending: Internal Medicine | Admitting: Internal Medicine

## 2017-09-04 DIAGNOSIS — S91311A Laceration without foreign body, right foot, initial encounter: Secondary | ICD-10-CM

## 2017-09-04 DIAGNOSIS — Z23 Encounter for immunization: Secondary | ICD-10-CM | POA: Diagnosis not present

## 2017-09-04 DIAGNOSIS — W260XXA Contact with knife, initial encounter: Secondary | ICD-10-CM | POA: Diagnosis not present

## 2017-09-04 MED ORDER — TETANUS-DIPHTH-ACELL PERTUSSIS 5-2.5-18.5 LF-MCG/0.5 IM SUSP
0.5000 mL | Freq: Once | INTRAMUSCULAR | Status: AC
  Administered 2017-09-04: 0.5 mL via INTRAMUSCULAR

## 2017-09-04 MED ORDER — TETANUS-DIPHTH-ACELL PERTUSSIS 5-2.5-18.5 LF-MCG/0.5 IM SUSP
INTRAMUSCULAR | Status: AC
  Filled 2017-09-04: qty 0.5

## 2017-09-04 NOTE — ED Triage Notes (Signed)
Pt here with right foot laceration; bleeding controlled

## 2017-09-04 NOTE — ED Provider Notes (Signed)
[facility]    CSN: [id number] Arrival date & time: 09/04/17  1853     History   Chief Complaint Chief Complaint  Patient presents with  . Laceration    HPI Janice Werner is a 67 y.o. female.   67 year old female comes in for few hour history of laceration to the dorsum of the right foot over 1st MTP joint. States a rotary knife fell on top of her foot. Bleeding controlled. Denies numbness/tingling. Able to move toe without problems. Last tetanus 9-10 year ago.      Past Medical History:  Diagnosis Date  . Arthritis   . Headache    migraines  . Hypertension     Patient Active Problem List   Diagnosis Date Noted  . Essential hypertension 12/07/2016  . Primary osteoarthritis of left hip 11/07/2016    Past Surgical History:  Procedure Laterality Date  . ABDOMINAL HYSTERECTOMY    . BACK SURGERY     lumbar disc surgery fracture  . bone spur Left    foot  . CHOLECYSTECTOMY    . DILATION AND CURETTAGE OF UTERUS    . JOINT REPLACEMENT Left    Knee replacement  . LEFT OOPHORECTOMY    . TOTAL HIP ARTHROPLASTY Left 12/06/2017  . TOTAL HIP ARTHROPLASTY Left 12/06/2016   Procedure: LEFT TOTAL HIP ARTHROPLASTY ANTERIOR APPROACH;  Surgeon: Sheral ApleyMurphy, Timothy D, MD;  Location: [facility];  Service: Orthopedics;  Laterality: Left;  . TUBAL LIGATION      OB History   None      Home Medications    Prior to Admission medications   Medication Sig Start Date End Date Taking? Authorizing Provider  amLODipine (NORVASC) 5 MG tablet Take 5 mg by mouth daily.    [provider]  aspirin EC 325 MG tablet Take 1 tablet (325 mg total) by mouth daily. For 30 days post op for DVT Prophylaxis 12/06/16   Albina BilletMartensen, Henry Calvin III, PA-C  celecoxib (CELEBREX) 200 MG capsule Take 1 capsule (200 mg total) by mouth 2 (two) times daily. For 2 weeks post op.  Discontinue Ibuprofen when taking this medicine. 12/06/16 12/06/17  Albina BilletMartensen, Henry Calvin III, PA-C  docusate  sodium (COLACE) 100 MG capsule Take 1 capsule (100 mg total) by mouth 2 (two) times daily. To prevent constipation while taking pain medication. 12/06/16   Albina BilletMartensen, Henry Calvin III, PA-C  HYDROcodone-acetaminophen (NORCO) 5-325 MG tablet Take 1-2 tablets by mouth every 4 (four) hours as needed for moderate pain. 12/06/16   Albina BilletMartensen, Henry Calvin III, PA-C  losartan (COZAAR) 25 MG tablet Take 25 mg by mouth at bedtime.    [provider]  lovastatin (MEVACOR) 10 MG tablet Take 10 mg by mouth at bedtime.    [provider]  methocarbamol (ROBAXIN) 500 MG tablet Take 1 tablet (500 mg total) by mouth every 6 (six) hours as needed for muscle spasms. 12/06/16   Albina BilletMartensen, Henry Calvin III, PA-C  omeprazole (PRILOSEC) 20 MG capsule Take 1 capsule (20 mg total) by mouth daily. 30 days for gastroprotection while taking Aspirin. 12/06/16   Martensen, Lucretia KernHenry Calvin III, PA-C  ondansetron (ZOFRAN) 4 MG tablet Take 1 tablet (4 mg total) by mouth every 8 (eight) hours as needed for nausea or vomiting. 12/06/16   Albina BilletMartensen, Henry Calvin III, PA-C  traMADol (ULTRAM) 50 MG tablet Take 1 tablet (50 mg total) by mouth every 6 (six) hours as needed for severe pain. 04/13/17   Felicie MornSmith, David,  NP    Family History Family History  Problem Relation Age of Onset  . Hypertension Mother   . Hypertension Father     Social History Social History   Tobacco Use  . Smoking status: Never Smoker  . Smokeless tobacco: Never Used  Substance Use Topics  . Alcohol use: No  . Drug use: No     Allergies   Codeine and Triazolam   Review of Systems Review of Systems  Reason unable to perform ROS: See HPI as above.     Physical Exam Triage Vital Signs ED Triage Vitals [09/04/17 2019]  Enc Vitals Group     BP (!) 181/120     Pulse Rate 77     Resp 18     Temp 98.2 F (36.8 C)     Temp Source Oral     SpO2 96 %     Weight      Height      Head Circumference      Peak Flow      Pain Score       Pain Loc      Pain Edu?      Excl. in GC?    No data found.  Updated Vital Signs BP (!) 181/120 (BP Location: Left Arm)   Pulse 77   Temp 98.2 F (36.8 C) (Oral)   Resp 18   SpO2 96%   Physical Exam  Constitutional: She is oriented to person, place, and time. She appears well-developed and well-nourished. No distress.  HENT:  Head: Normocephalic and atraumatic.  Eyes: Pupils are equal, round, and reactive to light. Conjunctivae are normal.  Musculoskeletal:  1.5 cm laceration to the dorsum of the right foot over 1st MTP joint. Bleeding controlled. Full ROM of toe. Sensation intact. Pedal pulse 2+ and equal bilaterally. Cap refill <2s.   Neurological: She is alert and oriented to person, place, and time.     UC Treatments / Results  Labs (all labs ordered are listed, but only abnormal results are displayed) Labs Reviewed - No data to display  EKG None  Radiology No results found.  Procedures Laceration Repair Date/Time: 09/04/2017 8:57 PM Performed by: Belinda Fisher, PA-C Authorized by: Isa Rankin, MD   Consent:    Consent obtained:  Verbal   Consent given by:  Patient   Risks discussed:  Infection, pain, poor cosmetic result and poor wound healing   Alternatives discussed:  Referral Anesthesia (see MAR for exact dosages):    Anesthesia method:  Local infiltration   Local anesthetic:  Lidocaine 2% WITH epi Laceration details:    Location:  Foot   Foot location:  Top of R foot   Length (cm):  1.5   Depth (mm):  2 Repair type:    Repair type:  Simple Pre-procedure details:    Preparation:  Patient was prepped and draped in usual sterile fashion Exploration:    Hemostasis achieved with:  Direct pressure and epinephrine   Wound exploration: wound explored through full range of motion and entire depth of wound probed and visualized   Treatment:    Area cleansed with:  Hibiclens   Amount of cleaning:  Standard   Irrigation solution:  Sterile water    Irrigation method:  Pressure wash   Visualized foreign bodies/material removed: no   Skin repair:    Repair method:  Sutures   Suture size:  5-0   Suture material:  Prolene   Suture technique:  Simple  interrupted   Number of sutures:  3 Approximation:    Approximation:  Close Post-procedure details:    Dressing:  Antibiotic ointment and bulky dressing   Patient tolerance of procedure:  Tolerated well, no immediate complications   (including critical care time)  Medications Ordered in UC Medications  Tdap (BOOSTRIX) injection 0.5 mL (0.5 mLs Intramuscular Given 09/04/17 2057)    Initial Impression / Assessment and Plan / UC Course  I have reviewed the triage vital signs and the nursing notes.  Pertinent labs & imaging results that were available during my care of the patient were reviewed by me and considered in my medical decision making (see chart for details).    Tetanus updated. 3 sutures applied. Wound care instructions discussed. Return precautions given. Otherwise, follow up in 7 days for suture removal. Patient expresses understanding and agrees to plan.  Final Clinical Impressions(s) / UC Diagnoses   Final diagnoses:  Laceration of dorsum of foot, right, initial encounter    ED Prescriptions    None        Belinda Fisher, PA-C 09/04/17 2100

## 2017-09-04 NOTE — Discharge Instructions (Signed)
3 sutures placed. You can remove current dressing in 24 hours.  Keep area clean and dry, he can clean area with soap and water after 24 hours.  Do not soak wound in water.  Monitor for spreading redness, increased warmth, drainage, fever, follow-up for reevaluation.  Otherwise follow-up here or with PCP in 7 days for suture removal.  Tetanus updated today.
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
## 2018-05-30 ENCOUNTER — Encounter: Payer: Self-pay | Admitting: Cardiology

## 2018-05-30 ENCOUNTER — Other Ambulatory Visit: Payer: Self-pay

## 2018-05-30 ENCOUNTER — Ambulatory Visit (INDEPENDENT_AMBULATORY_CARE_PROVIDER_SITE_OTHER): Payer: Medicare HMO | Admitting: Cardiology

## 2018-05-30 VITALS — BP 148/92 | HR 67 | Ht 66.0 in | Wt 217.0 lb

## 2018-05-30 DIAGNOSIS — I10 Essential (primary) hypertension: Secondary | ICD-10-CM | POA: Diagnosis not present

## 2018-05-30 DIAGNOSIS — E785 Hyperlipidemia, unspecified: Secondary | ICD-10-CM

## 2018-05-30 DIAGNOSIS — E668 Other obesity: Secondary | ICD-10-CM

## 2018-08-23 IMAGING — RF DG HIP (WITH PELVIS) OPERATIVE*L*
1 series · 2 of 2 positions shown · non-contrast
Comparison: None.

CLINICAL DATA: Left hip placement

EXAM:
DG C-ARM 61-120 MIN; OPERATIVE LEFT HIP WITH PELVIS

[Series 1: run · 2 of 2 slices shown]
[im 1/2]
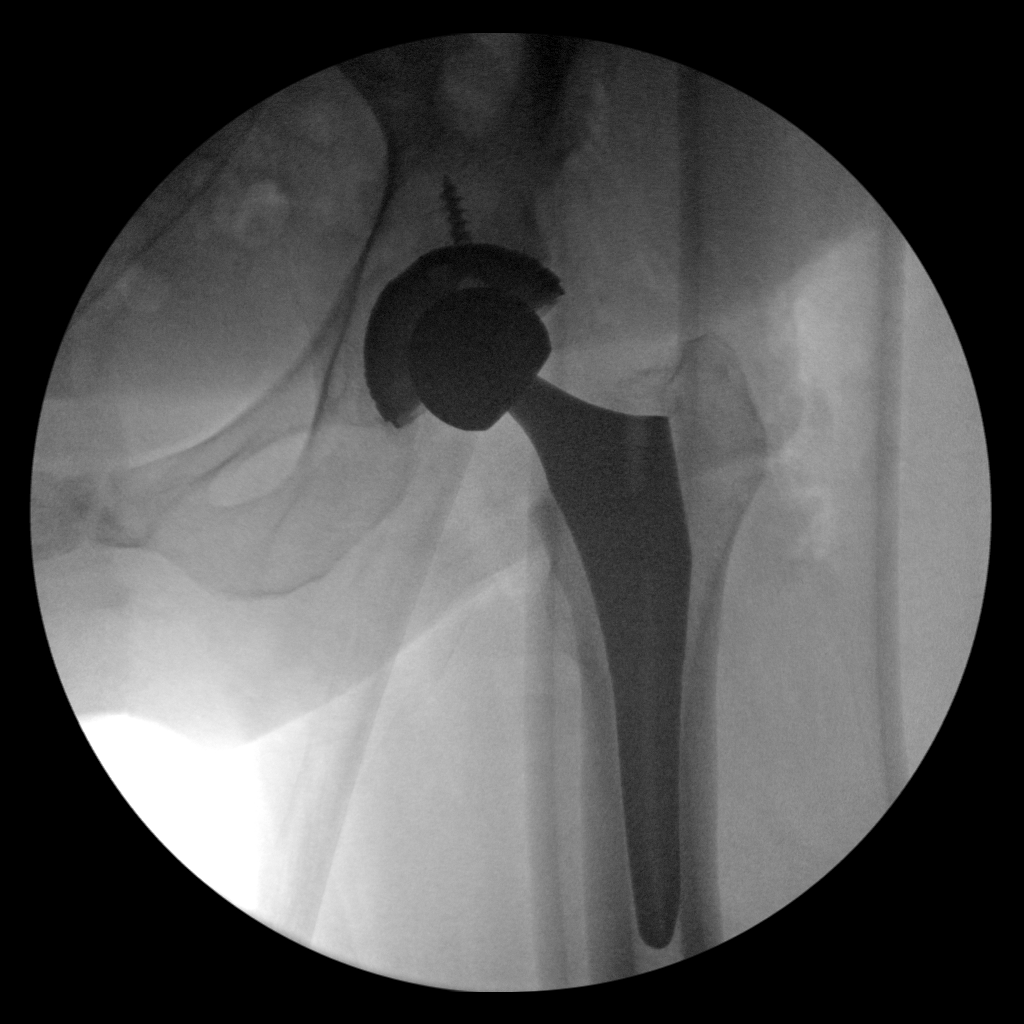
[im 2/2]
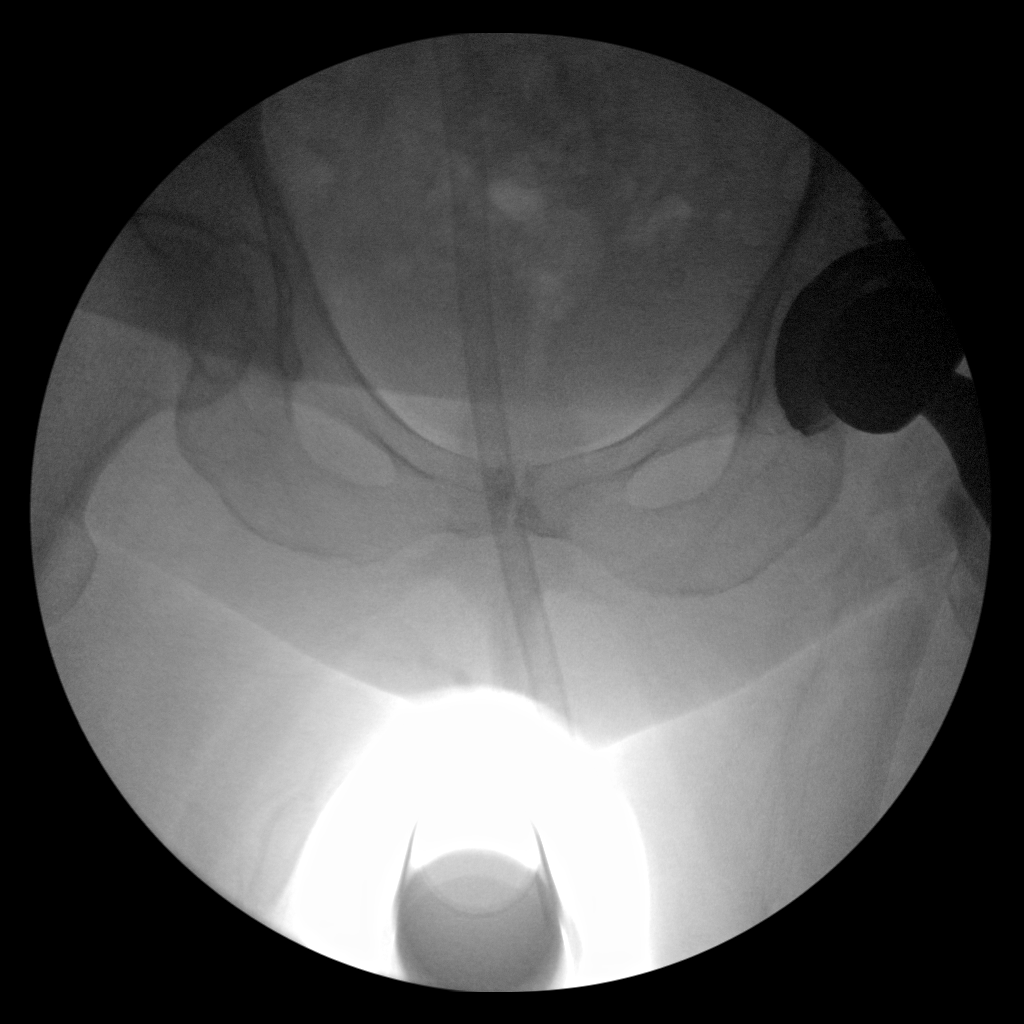

[2 of 2 positions shown; findings below may reference images not displayed]

FINDINGS: Changes of left hip replacement. Normal AP alignment. No hardware or
bony complicating feature.
IMPRESSION: Left hip replacement without visible complicating feature.

## 2018-12-29 IMAGING — CR DG WRIST COMPLETE 3+V*L*
4 series · 4 of 4 positions shown · non-contrast
Comparison: None.

CLINICAL DATA: Generalized wrist pain after fall last evening.
History of gout.

EXAM:
LEFT WRIST - COMPLETE 3+ VIEW

[wrist pa]
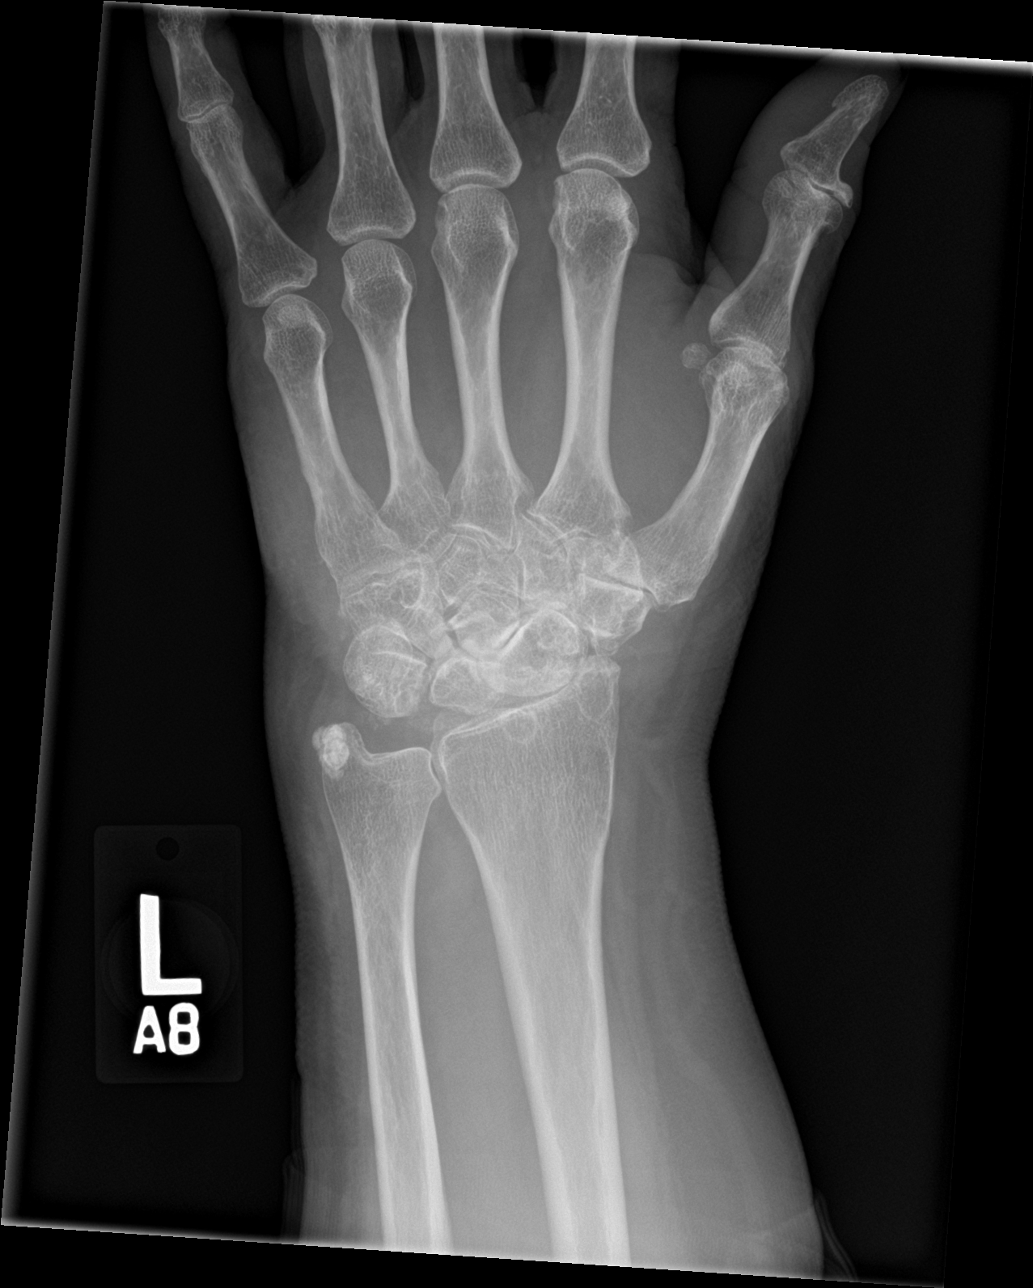

[wrist obl]
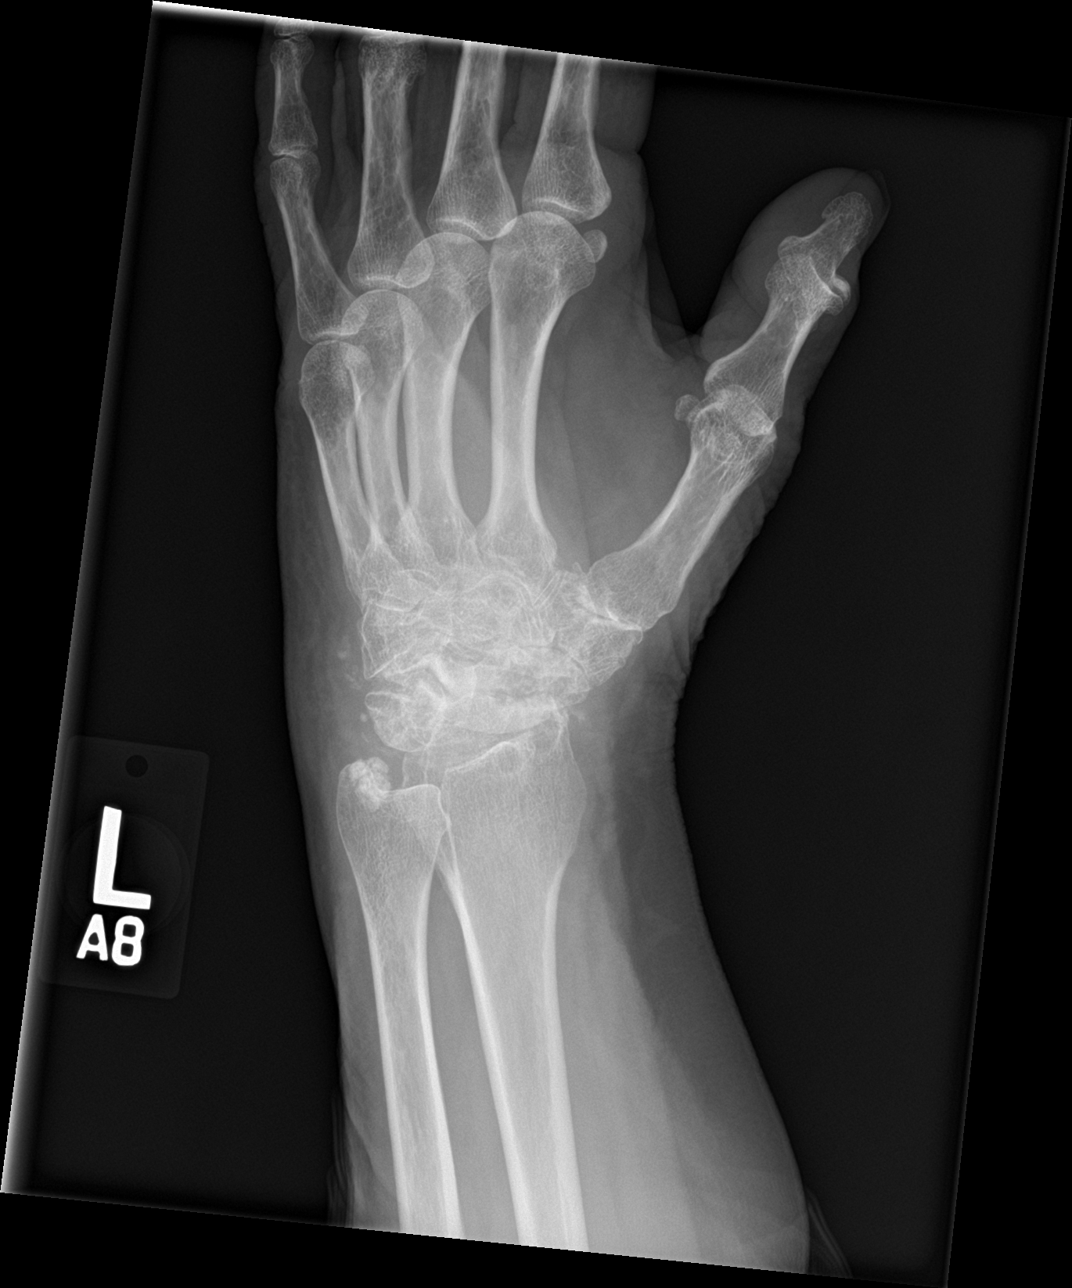

[wrist lat]
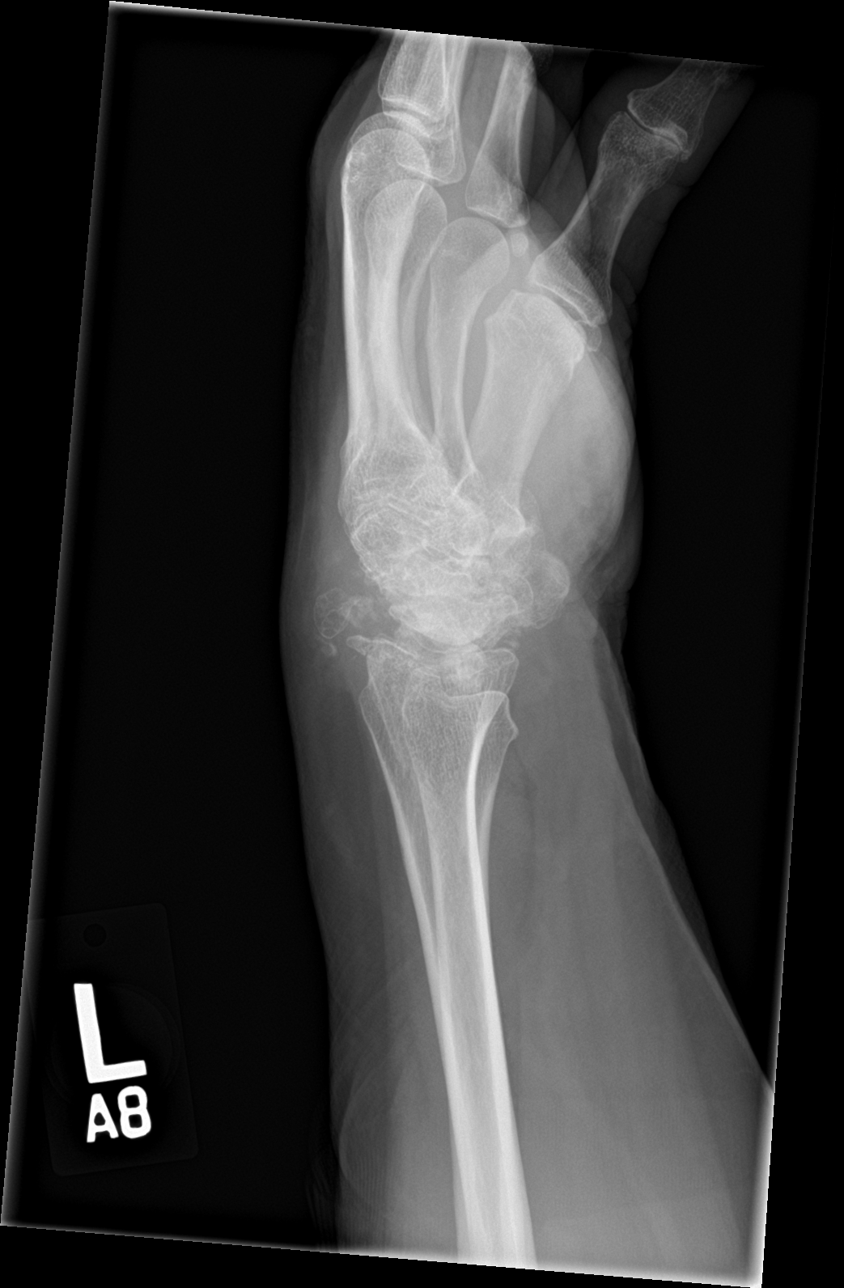

[wrist navicular]
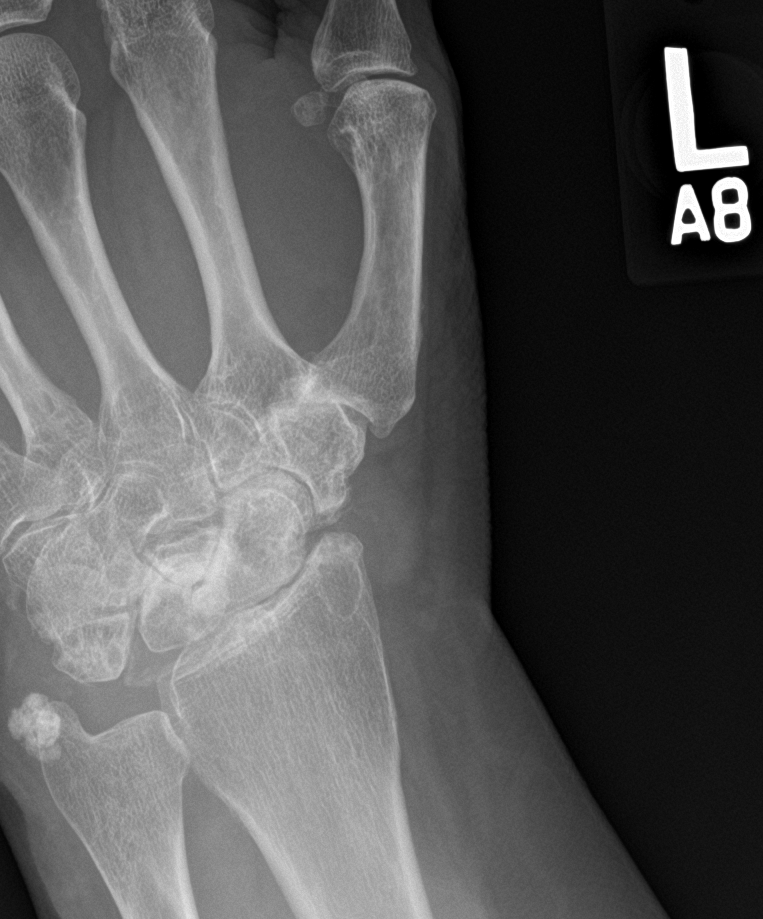

[4 of 4 positions shown; findings below may reference images not displayed]

FINDINGS: Mid carpal and radiocarpal joint space narrowing with subchondral
cystic change of the distal radius. Soft tissue calcifications are
noted adjacent to the styloid of the radius. There is soft tissue
swelling of the hand and wrist. No acute fractures identified. There
is chondrocalcinosis.
IMPRESSION: Soft tissue swelling of the dorsum of the hand and wrist. No acute
displaced appearing fracture. Cystic and degenerative change of the
carpal bones and distal radius with soft tissue calcifications and
chondrocalcinosis, likely a result of the patient's known
crystalline arthropathy or calcium pyrophosphate deposition disease.

## 2019-05-30 ENCOUNTER — Ambulatory Visit: Payer: Medicare HMO | Admitting: Cardiology

## 2019-05-31 ENCOUNTER — Ambulatory Visit: Payer: Medicare HMO | Admitting: Cardiology

## 2019-06-10 ENCOUNTER — Encounter: Payer: Self-pay | Admitting: Cardiology

## 2019-06-10 ENCOUNTER — Ambulatory Visit: Payer: Medicare HMO | Admitting: Cardiology

## 2019-06-10 ENCOUNTER — Other Ambulatory Visit: Payer: Self-pay

## 2019-06-10 VITALS — BP 170/90 | HR 95 | Temp 97.8°F | Ht 66.0 in | Wt 234.0 lb

## 2019-06-10 DIAGNOSIS — E6609 Other obesity due to excess calories: Secondary | ICD-10-CM

## 2019-06-10 DIAGNOSIS — Z6837 Body mass index (BMI) 37.0-37.9, adult: Secondary | ICD-10-CM

## 2019-06-10 DIAGNOSIS — E782 Mixed hyperlipidemia: Secondary | ICD-10-CM

## 2019-06-10 DIAGNOSIS — I10 Essential (primary) hypertension: Secondary | ICD-10-CM

## 2019-06-10 MED ORDER — AMLODIPINE BESYLATE 5 MG PO TABS: 5.0000 mg | ORAL_TABLET | Freq: Every day | ORAL | 3 refills | Status: DC

## 2019-06-10 MED ORDER — AMLODIPINE BESYLATE 5 MG PO TABS: 5.0000 mg | ORAL_TABLET | Freq: Every morning | ORAL | 1 refills | Status: DC

## 2019-06-10 MED ORDER — LOSARTAN POTASSIUM 25 MG PO TABS: 25.0000 mg | ORAL_TABLET | Freq: Every day | ORAL | 1 refills | Status: DC

## 2019-06-10 NOTE — Progress Notes (Signed)
I believe the 220pm  EKG is not this patient. Please verify. I think this was due to wrong charting between the two patient. Please work with [facility] to error the wrong EKG so this patient's EMR is correct. Let me when fixed so I can sign off on the right EKG.

## 2019-06-10 NOTE — Patient Instructions (Signed)
Please keep a blood pressure log and bring that in at  your next office visit. Call the office if the top number is consistently greater than .   Please remember to bring in your medication bottles in at the next visit.   New Medications that were added at today's visit:  Losartan 25 mg p.o. q. evening Norvasc 5 mg p.o. every morning  Medications that were discontinued at today's visit: None  Please get labs done today and in about 1 week after initiating blood pressure pills at the nearest [facility].  Recommend follow up with your PCP as scheduled.

## 2019-06-10 NOTE — Progress Notes (Signed)
Chief Complaint  Patient presents with  . Hypertension    Follow up    REQUESTING PHYSICIAN:  Roger Kill, PA-C [address] [place],  [place] [address]  HPI  Janice Werner is a 69 y.o. female who presents to the office with a chief complaint of "hypertension." Patient's past medical history and cardiac risk factors include: Hypertension, hyperlipidemia, postmenopausal female, advanced age.  Patient presents to the office today for 1 year follow-up for the management of hypertension.  Patient's blood pressure is not controlled and is currently not on pharmacological therapy.  Patient states that she was on multiple antihypertensive medications which made her tired, fatigue, decreased energy and therefore she stopped taking all of them.  Patient's blood pressure continues to be elevated and she is contributing this to underlying stress since her mom recently passed away in 04-13-19.  Patient continues to be emotional as she is going through the grief of losing a loved one.  Patient has also stopped taking her statin therapy for the same reasons described above.  Currently patient denies chest pain, shortness of breath at rest or effort related symptoms, lightheadedness, dizziness, palpitations, orthopnea, paroxysmal nocturnal dyspnea, lower extremity swelling, near syncope, syncopal events, hematochezia, hemoptysis, hematemesis, melanotic stools, no symptoms of amaurosis fugax, motor or sensory symptoms or dysphasia in the last 6 months.   FUNCTIONAL STATUS: She uses a Actuary periodically to maintain her lawn.   ALLERGIES: Allergies  Allergen Reactions  . Codeine Nausea Only and Other (See Comments)    GI Upset (intolerance) Severe stabbing stomach pain  . Triazolam Other (See Comments)    Mental Status Changes (intolerance) hallucinations   MEDICATION LIST PRIOR TO VISIT: Current Outpatient Medications on File Prior to Visit  Medication Sig  Dispense Refill  . acetaminophen (TYLENOL) 500 MG tablet Take 2 tablets by mouth daily.    . vitamin B-12 (CYANOCOBALAMIN) 1000 MCG tablet Take 1,000 mcg by mouth daily.     No current facility-administered medications on file prior to visit.    PAST MEDICAL HISTORY: Past Medical History:  Diagnosis Date  . Arthritis   . Headache    migraines  . Hypertension   . Leg edema   . Malaise and fatigue     PAST SURGICAL HISTORY: Past Surgical History:  Procedure Laterality Date  . ABDOMINAL HYSTERECTOMY    . BACK SURGERY     lumbar disc surgery fracture  . bone spur Left    foot  . CHOLECYSTECTOMY    . DILATION AND CURETTAGE OF UTERUS    . JOINT REPLACEMENT Left    Knee replacement  . LEFT OOPHORECTOMY    . TOTAL HIP ARTHROPLASTY Left 12/06/2017  . TOTAL HIP ARTHROPLASTY Left 12/06/2016   Procedure: LEFT TOTAL HIP ARTHROPLASTY ANTERIOR APPROACH;  Surgeon: Sheral Apley, MD;  Location: [facility] OR;  Service: Orthopedics;  Laterality: Left;  . TUBAL LIGATION      FAMILY HISTORY: The patient family history includes Alzheimer's disease in her mother; Breast cancer in her mother; Hypertension in her brother, father, and mother; Parkinson's disease in her father; Rheum arthritis in her brother.   SOCIAL HISTORY:  The patient  reports that she has never smoked. She has never used smokeless tobacco. She reports that she does not drink alcohol or use drugs.  14 ORGAN REVIEW OF SYSTEMS: CONSTITUTIONAL: No fever or significant weight loss EYES: No recent significant visual change EARS, NOSE, MOUTH, THROAT: No recent significant change  in hearing CARDIOVASCULAR: See discussion in subjective/HPI RESPIRATORY: See discussion in subjective/HPI GASTROINTESTINAL: No recent complaints of abdominal pain GENITOURINARY: No recent significant change in genitourinary status MUSCULOSKELETAL: No recent significant change in musculoskeletal status INTEGUMENTARY: No recent rash NEUROLOGIC: No recent  significant change in motor function PSYCHIATRIC: No recent significant change in mood ENDOCRINOLOGIC: No recent significant change in endocrine status HEMATOLOGIC/LYMPHATIC: No recent significant unexpected bruising ALLERGIC/IMMUNOLOGIC: No recent unexplained allergic reaction  PHYSICAL EXAM: Vitals with BMI 06/10/2019 06/10/2019 05/30/2018  Height - 5\' 6"  -  Weight - 234 lbs -  BMI - 09.81 -  Systolic 191 478 295  Diastolic 90 621 92  Pulse 95 104 -  Repeat blood pressure in the left arm 170/90 and right arm 170/82  CONSTITUTIONAL: Well-developed and well-nourished. No acute distress.  SKIN: Skin is warm and dry. No rash noted. No cyanosis. No pallor. No jaundice HEAD: Normocephalic and atraumatic.  EYES: No scleral icterus MOUTH/THROAT: Moist oral membranes.  NECK: No JVD present. No thyromegaly noted. No carotid bruits  LYMPHATIC: No visible cervical adenopathy.  CHEST Normal respiratory effort. No intercostal retractions  LUNGS: Clear to auscultation bilaterally.  No stridor. No wheezes. No rales.  CARDIOVASCULAR: Regular rate and rhythm, positive H0-Q6, soft holosystolic murmur heard at the left lower sternal border, no gallops or rubs. ABDOMINAL: Obese, soft, nontender, nondistended, positive bowel sounds in all 4 quadrants, no apparent ascites.  EXTREMITIES: No peripheral edema  HEMATOLOGIC: No significant bruising NEUROLOGIC: Oriented to person, place, and time. Nonfocal. Normal muscle tone.  PSYCHIATRIC: Normal mood and affect. Normal behavior. Cooperative  EKG 05/30/2018: Normal sinus rhythm at 75 bpm, normal axis, 1 PVC, nonspecific T wave abnormality. No changes from EKG 05/09/2017  Lower extremity venous duplex 05/18/2017: No evidence of deep vein thrombosis of the left lower extremity with normal venous return.  Echocardiogram 11/25/2016: Left ventricle cavity is normal in size. Mild concentric hypertrophy of the left ventricle. Normal global wall motion.  Indeterminate diastolic filling pattern. Calculated EF 61%. Left atrial cavity is mildly dilated. Mild (Grade I) mitral regurgitation. Mild to moderate tricuspid regurgitation. Pulmonary artery systolic pressure is estimated at 28-33 mm Hg.  Lexiscan myoview stress test 11/25/2016: 1. The resting electrocardiogram demonstrated normal sinus rhythm and normal resting conduction. Poor R wave progression. Stress EKG is non diagnostic for ischemia as it is a pharmacologic stress using Lexiscan. Stress symptoms included dyspnea, dizziness. 2. The RAW images reveal breast tissue attenuation in the inferior and septal region. Superimposed small sized moderate ischemia in the mid anteroseptal and apical septal myocardial wall(s) cannot be excluded. Gated SPECT images reveal normal myocardial thickening and wall motion. The left ventricular ejection fraction was calculated or visually estimated to be 56%. This is a low risk study. Clinical correlation recommended in a patient with BMI 38.  LABORATORY DATA: CBC Latest Ref Rng & Units 11/25/2016  WBC 4.0 - 10.5 K/uL 6.5  Hemoglobin 12.0 - 15.0 g/dL 13.7  Hematocrit 36.0 - 46.0 % 42.7  Platelets 150 - 400 K/uL 264    CMP Latest Ref Rng & Units 11/25/2016  Glucose 65 - 99 mg/dL 93  BUN 6 - 20 mg/dL 18  Creatinine 0.44 - 1.00 mg/dL 0.83  Sodium 135 - 145 mmol/L 142  Potassium 3.5 - 5.1 mmol/L 3.8  Chloride 101 - 111 mmol/L 109  CO2 22 - 32 mmol/L 24  Calcium 8.9 - 10.3 mg/dL 9.0    FINAL MEDICATION LIST END OF ENCOUNTER: Meds ordered this encounter  Medications  .  losartan (COZAAR) 25 MG tablet    Sig: Take 1 tablet (25 mg total) by mouth at bedtime.    Dispense:  30 tablet    Refill:  1  . DISCONTD: amLODipine (NORVASC) 5 MG tablet    Sig: Take 1 tablet (5 mg total) by mouth in the morning.    Dispense:  30 tablet    Refill:  1  . amLODipine (NORVASC) 5 MG tablet    Sig: Take 1 tablet (5 mg total) by mouth daily.    Dispense:  180 tablet     Refill:  3      Current Outpatient Medications:  .  acetaminophen (TYLENOL) 500 MG tablet, Take 2 tablets by mouth daily., Disp: , Rfl:  .  losartan (COZAAR) 25 MG tablet, Take 1 tablet (25 mg total) by mouth at bedtime., Disp: 30 tablet, Rfl: 1 .  vitamin B-12 (CYANOCOBALAMIN) 1000 MCG tablet, Take 1,000 mcg by mouth daily., Disp: , Rfl:  .  amLODipine (NORVASC) 5 MG tablet, Take 1 tablet (5 mg total) by mouth daily., Disp: 180 tablet, Rfl: 3  IMPRESSION:    ICD-10-CM   1. Essential hypertension  I10 EKG 12-Lead    losartan (COZAAR) 25 MG tablet    Basic Metabolic Panel (BMET)    Magnesium    Basic metabolic panel    Magnesium    amLODipine (NORVASC) 5 MG tablet    Basic metabolic panel    Magnesium    DISCONTINUED: amLODipine (NORVASC) 5 MG tablet  2. Class 2 obesity due to excess calories without serious comorbidity with body mass index (BMI) of 37.0 to 37.9 in adult  E66.09    Z68.37   3. Mixed hyperlipidemia  E78.2 Lipid Panel With LDL/HDL Ratio     RECOMMENDATIONS: Janice Werner is a 69 y.o. female whose past medical history and cardiac risk factors include: Hypertension, hyperlipidemia, postmenopausal female, advanced age.  Benign essential hypertension: Uncontrolled.  Patient presents today for 1 year follow-up for the management of high blood pressure.  Patient states that she stopped taking pharmacological therapy as it made her feel tired, fatigue.  Patient blood pressure at today's office visit is not controlled.  I had a long discussion with the patient on the comorbidities associated with uncontrolled high blood pressure which includes but not limited to development of chronic kidney disease, hypertensive heart disease, and stroke.  Patient is agreeable to restart her antihypertensive medications slowly.  Recommend baseline BMP and magnesium level to evaluate kidney function and electrolytes.  Restart losartan 25 mg p.o. every evening  Restart Norvasc 5  mg p.o. daily  Check BMP and magnesium level in 1 week after initiating antihypertensive medications.  Patient states that she does not have a blood pressure cuff at home.  We will enroll the patient in PCM to help with further titration of antihypertensive medications.  Mixed hyperlipidemia:  Recheck fasting lipid profile.  For now educated on importance of lifestyle modifications, dietary changes.  Obesity, due to excess calories: Body mass index is 37.77 kg/m.  I reviewed with the patient the importance of diet, regular physical activity/exercise, weight loss.    Patient is educated on increasing physical activity gradually as tolerated.  With the goal of moderate intensity exercise for 30 minutes a day 5 days a week.   Orders Placed This Encounter  Procedures  . Basic Metabolic Panel (BMET)  . Magnesium  . Basic metabolic panel  . Magnesium  . Lipid Panel With LDL/HDL Ratio  .  EKG 12-Lead   --Continue cardiac medications as reconciled in final medication list. --Return in about 2 weeks (around 06/24/2019) for Discussion BP . Or sooner if needed. --Continue follow-up with your primary care physician regarding the management of your other chronic comorbid conditions.  Patient's questions and concerns were addressed to her satisfaction. She voices understanding of the instructions provided during this encounter.   This note was created using a voice recognition software as a result there may be grammatical errors inadvertently enclosed that do not reflect the nature of this encounter. Every attempt is made to correct such errors.  Tessa Lerner, DO, [facility] Office: [phone]

## 2019-06-11 NOTE — Progress Notes (Signed)
(  Error.) In progress.

## 2019-06-11 NOTE — Progress Notes (Signed)
Error corrected  

## 2019-06-26 ENCOUNTER — Encounter: Payer: Self-pay | Admitting: Cardiology

## 2019-06-26 ENCOUNTER — Other Ambulatory Visit: Payer: Self-pay

## 2019-06-26 ENCOUNTER — Ambulatory Visit: Payer: Medicare HMO | Admitting: Cardiology

## 2019-06-26 VITALS — BP 147/85 | HR 96 | Temp 98.0°F | Resp 16 | Ht 66.0 in | Wt 234.0 lb

## 2019-06-26 DIAGNOSIS — E785 Hyperlipidemia, unspecified: Secondary | ICD-10-CM

## 2019-06-26 DIAGNOSIS — E782 Mixed hyperlipidemia: Secondary | ICD-10-CM

## 2019-06-26 DIAGNOSIS — E668 Other obesity: Secondary | ICD-10-CM

## 2019-06-26 DIAGNOSIS — E6609 Other obesity due to excess calories: Secondary | ICD-10-CM

## 2019-06-26 DIAGNOSIS — I10 Essential (primary) hypertension: Secondary | ICD-10-CM

## 2019-06-26 NOTE — Progress Notes (Signed)
Chief Complaint  Patient presents with  . Hypertension  . Follow-up    2 week    REQUESTING PHYSICIAN:  Heywood Bene, PA-C [address] [place],  [place] [id number]  HPI  Janice Werner is a 69 y.o. female who presents to the office with a chief complaint of "hypertension." Patient's past medical history and cardiac risk factors include: Hypertension, hyperlipidemia, postmenopausal female, advanced age.  Patient was last seen in the office on June 10, 2019 for management of high blood pressures.  At that time I saw the patient for the first time for 1 year follow-up and noted that her blood pressures were not well controlled.  Patient stated that she stopped taking all her antihypertensive medications because they were causing her to feel more tired, fatigue, and decreased energy.  At the last office visit I convinced the patient that her blood pressures were significantly elevated and she is more prone for endorgan damage due to uncontrolled hypertension.  Patient agreed to be restarted on her antihypertensive medications.  She only wants to be on 2 blood pressure pills and at the current doses.  She was started on amlodipine and losartan.  Patient has tolerated the medications well without any significant side effects.  She did have blood work drawn earlier this morning and they are currently pending.  At the last office visit she was enrolled into principal care management for management for her hypertension.  Her initial blood pressure reading was 174/115 and it has improved over the last 2 weeks and the most recent blood pressure reading was 135/89.  Patient states that the blood pressures are usually higher in the morning before he takes her medications.  In addition, patient does not sleep well at night  she is usually up from 2 AM to 6 AM which may also be contributing to her elevated blood pressures in the morning.  Clinically I suspect that she has a degree of sleep  apnea and I have encouraged her to be evaluated in the past as well.  Patient had refused at the last office visit but is more acceptable today and would like to be referred to sleep medicine.  Her viewpoint about why she does not want to increase BP medications is that anti-hypertensive medications cause her to be drowsy. But it could be secondary to not getting enough sleep at night.  Patient's office blood pressures are not well controlled, but improving.  We considered uptitrating antihypertensive medications.  Patient refuses to take any more blood pressure pills or increasing the current doses.  Patient verbalizes understanding that blood pressures which are not well controlled can cause end - organ damage to brain, heart, and kidneys which eventually contributes to morbidity and mortality.  She continues to be adamant about not uptitrating antihypertensive medications.  Patient has also stopped taking her statin therapy prior to seeing me and does not want it to be restarted, will defer to primary team.  Currently patient denies chest pain, shortness of breath at rest or effort related symptoms, lightheadedness, dizziness, palpitations, orthopnea, paroxysmal nocturnal dyspnea, lower extremity swelling, near syncope, syncopal events, hematochezia, hemoptysis, hematemesis, melanotic stools, no symptoms of amaurosis fugax, motor or sensory symptoms or dysphasia in the last 6 months.   FUNCTIONAL STATUS: She uses a Network engineer periodically to maintain her lawn.   ALLERGIES: Allergies  Allergen Reactions  . Codeine Nausea Only and Other (See Comments)    GI Upset (intolerance) Severe stabbing stomach  pain  . Triazolam Other (See Comments)    Mental Status Changes (intolerance) hallucinations   MEDICATION LIST PRIOR TO VISIT: Current Outpatient Medications on File Prior to Visit  Medication Sig Dispense Refill  . acetaminophen (TYLENOL) 500 MG tablet Take 2 tablets by mouth daily.    Marland Kitchen  amLODipine (NORVASC) 5 MG tablet Take 1 tablet (5 mg total) by mouth daily. 180 tablet 3  . losartan (COZAAR) 25 MG tablet Take 1 tablet (25 mg total) by mouth at bedtime. 30 tablet 1   No current facility-administered medications on file prior to visit.    PAST MEDICAL HISTORY: Past Medical History:  Diagnosis Date  . Arthritis   . Headache    migraines  . Hypertension   . Leg edema   . Malaise and fatigue     PAST SURGICAL HISTORY: Past Surgical History:  Procedure Laterality Date  . ABDOMINAL HYSTERECTOMY    . BACK SURGERY     lumbar disc surgery fracture  . bone spur Left    foot  . CHOLECYSTECTOMY    . DILATION AND CURETTAGE OF UTERUS    . JOINT REPLACEMENT Left    Knee replacement  . LEFT OOPHORECTOMY    . TOTAL HIP ARTHROPLASTY Left 12/06/2017  . TOTAL HIP ARTHROPLASTY Left 12/06/2016   Procedure: LEFT TOTAL HIP ARTHROPLASTY ANTERIOR APPROACH;  Surgeon: Sheral Apley, MD;  Location: [facility] OR;  Service: Orthopedics;  Laterality: Left;  . TUBAL LIGATION      FAMILY HISTORY: The patient family history includes Alzheimer's disease in her mother; Breast cancer in her mother; Hypertension in her brother, father, and mother; Parkinson's disease in her father; Rheum arthritis in her brother.   SOCIAL HISTORY:  The patient  reports that she has never smoked. She has never used smokeless tobacco. She reports that she does not drink alcohol or use drugs.  14 ORGAN REVIEW OF SYSTEMS: CONSTITUTIONAL: No fever or significant weight loss EYES: No recent significant visual change EARS, NOSE, MOUTH, THROAT: No recent significant change in hearing CARDIOVASCULAR: See discussion in subjective/HPI RESPIRATORY: See discussion in subjective/HPI GASTROINTESTINAL: No recent complaints of abdominal pain GENITOURINARY: No recent significant change in genitourinary status MUSCULOSKELETAL: No recent significant change in musculoskeletal status INTEGUMENTARY: No recent rash NEUROLOGIC:  No recent significant change in motor function PSYCHIATRIC: No recent significant change in mood ENDOCRINOLOGIC: No recent significant change in endocrine status HEMATOLOGIC/LYMPHATIC: No recent significant unexpected bruising ALLERGIC/IMMUNOLOGIC: No recent unexplained allergic reaction  PHYSICAL EXAM: Vitals with BMI 06/26/2019 06/10/2019 06/10/2019  Height 5\' 6"  - 5\' 6"   Weight 234 lbs - 234 lbs  BMI 37.79 - 37.79  Systolic 147 170  Diastolic 85 90 107  Pulse 96 95 104   CONSTITUTIONAL: Well-developed and well-nourished. No acute distress.  SKIN: Skin is warm and dry. No rash noted. No cyanosis. No pallor. No jaundice HEAD: Normocephalic and atraumatic.  EYES: No scleral icterus MOUTH/THROAT: Moist oral membranes.  NECK: No JVD present. No thyromegaly noted. No carotid bruits  LYMPHATIC: No visible cervical adenopathy.  CHEST Normal respiratory effort. No intercostal retractions  LUNGS: Clear to auscultation bilaterally.  No stridor. No wheezes. No rales.  CARDIOVASCULAR: Regular rate and rhythm, positive S1-S2, soft holosystolic murmur heard at the left lower sternal border, no gallops or rubs. ABDOMINAL: Obese, soft, nontender, nondistended, positive bowel sounds in all 4 quadrants, no apparent ascites.  EXTREMITIES: No peripheral edema  HEMATOLOGIC: No significant bruising NEUROLOGIC: Oriented to person, place, and time. Nonfocal. Normal muscle  tone.  PSYCHIATRIC: Normal mood and affect. Normal behavior. Cooperative  EKG 05/30/2018: Normal sinus rhythm at 75 bpm, normal axis, 1 PVC, nonspecific T wave abnormality. No changes from EKG 05/09/2017  Lower extremity venous duplex 05/18/2017: No evidence of deep vein thrombosis of the left lower extremity with normal venous return.  Echocardiogram 11/25/2016: Left ventricle cavity is normal in size. Mild concentric hypertrophy of the left ventricle. Normal global wall motion. Indeterminate diastolic filling pattern. Calculated EF  61%. Left atrial cavity is mildly dilated. Mild (Grade I) mitral regurgitation. Mild to moderate tricuspid regurgitation. Pulmonary artery systolic pressure is estimated at 28-33 mm Hg.  Lexiscan myoview stress test 11/25/2016: 1. The resting electrocardiogram demonstrated normal sinus rhythm and normal resting conduction. Poor R wave progression. Stress EKG is non diagnostic for ischemia as it is a pharmacologic stress using Lexiscan. Stress symptoms included dyspnea, dizziness. 2. The RAW images reveal breast tissue attenuation in the inferior and septal region. Superimposed small sized moderate ischemia in the mid anteroseptal and apical septal myocardial wall(s) cannot be excluded. Gated SPECT images reveal normal myocardial thickening and wall motion. The left ventricular ejection fraction was calculated or visually estimated to be 56%. This is a low risk study. Clinical correlation recommended in a patient with BMI 38.  LABORATORY DATA: CBC Latest Ref Rng & Units 11/25/2016  WBC 4.0 - 10.5 K/uL 6.5  Hemoglobin 12.0 - 15.0 g/dL 93.5  Hematocrit 70.1 - 46.0 % 42.7  Platelets 150 - 400 K/uL 264    CMP Latest Ref Rng & Units 11/25/2016  Glucose 65 - 99 mg/dL 93  BUN 6 - 20 mg/dL 18  Creatinine 7.79 - 3.90 mg/dL 3.00  Sodium 923 - 300 mmol/L 142  Potassium 3.5 - 5.1 mmol/L 3.8  Chloride 101 - 111 mmol/L 109  CO2 22 - 32 mmol/L 24  Calcium 8.9 - 10.3 mg/dL 9.0    FINAL MEDICATION LIST END OF ENCOUNTER: No orders of the defined types were placed in this encounter.     Current Outpatient Medications:  .  acetaminophen (TYLENOL) 500 MG tablet, Take 2 tablets by mouth daily., Disp: , Rfl:  .  amLODipine (NORVASC) 5 MG tablet, Take 1 tablet (5 mg total) by mouth daily., Disp: 180 tablet, Rfl: 3 .  losartan (COZAAR) 25 MG tablet, Take 1 tablet (25 mg total) by mouth at bedtime., Disp: 30 tablet, Rfl: 1  IMPRESSION:    ICD-10-CM   1. Essential hypertension  I10 Ambulatory referral to  Neurology  2. Class 2 obesity due to excess calories without serious comorbidity with body mass index (BMI) of 37.0 to 37.9 in adult  E66.09 Ambulatory referral to Neurology   Z68.37   3. Mixed hyperlipidemia  E78.2   4. Moderate obesity  E66.8 Ambulatory referral to Neurology  5. Mild hyperlipidemia  E78.5      RECOMMENDATIONS: Janice Werner is a 69 y.o. female whose past medical history and cardiac risk factors include: Hypertension, hyperlipidemia, postmenopausal female, advanced age.  Benign essential hypertension: Improving.  Patient is currently being monitored in the depressible care management program.  Her blood pressures have improved but currently not at goal.  As discussed above she does not want to uptitrate her medications and or doses.  She had blood work earlier this morning and is currently pending.    Patient will be referred to [facility] for sleep study evaluation.    Low-salt diet recommended.   If the blood pressure is consistently greater than  patient is asked to call the office or her primary care provider's office for medication titration sooner than the next office visit.  Low salt diet recommended. A diet that is rich in fruits, vegetables, legumes, and low-fat dairy products and low in snacks, sweets, and meats (such as the Dietary Approaches to Stop Hypertension [DASH] diet).    Orders Placed This Encounter  Procedures  . Ambulatory referral to Neurology   Total encounter time 30 minutes. *Total Encounter Time as defined by the Centers for Medicare and Medicaid Services includes, in addition to the face-to-face time of a patient visit (documented in the note above) non-face-to-face time: obtaining and reviewing outside history, ordering and reviewing medications, tests or procedures, care coordination (communications with other health care professionals or caregivers) and documentation in the medical record.  --Continue  cardiac medications as reconciled in final medication list. --Return in about 3 months (around 09/25/2019). Or sooner if needed. --Continue follow-up with your primary care physician regarding the management of your other chronic comorbid conditions.  Patient's questions and concerns were addressed to her satisfaction. She voices understanding of the instructions provided during this encounter.   This note was created using a voice recognition software as a result there may be grammatical errors inadvertently enclosed that do not reflect the nature of this encounter. Every attempt is made to correct such errors.  Tessa Lerner, DO, [facility] [place] Office: [phone]

## 2019-06-27 ENCOUNTER — Telehealth: Payer: Self-pay

## 2019-06-27 LAB — BASIC METABOLIC PANEL
BUN/Creatinine Ratio: 23 (ref 12–28)
BUN: 23 mg/dL (ref 8–27)
CO2: 23 mmol/L (ref 20–29)
Calcium: 9.5 mg/dL (ref 8.7–10.3)
Chloride: 106 mmol/L (ref 96–106)
Creatinine, Ser: 0.98 mg/dL (ref 0.57–1.00)
GFR calc Af Amer: 68 mL/min/{1.73_m2} (ref >59)
GFR calc non Af Amer: 59 mL/min/{1.73_m2} — ABNORMAL LOW (ref >59)
Glucose: 90 mg/dL (ref 65–99)
Potassium: 4.5 mmol/L (ref 3.5–5.2)
Sodium: 142 mmol/L (ref 134–144)

## 2019-06-27 LAB — LIPID PANEL WITH LDL/HDL RATIO
Cholesterol, Total: 205 mg/dL — ABNORMAL HIGH (ref 100–199)
HDL: 62 mg/dL (ref >39)
LDL Chol Calc (NIH): 111 mg/dL — ABNORMAL HIGH (ref 0–99)
LDL/HDL Ratio: 1.8 ratio (ref 0.0–3.2)
Triglycerides: 187 mg/dL — ABNORMAL HIGH (ref 0–149)
VLDL Cholesterol Cal: 32 mg/dL (ref 5–40)

## 2019-06-27 LAB — MAGNESIUM: Magnesium: 2.1 mg/dL (ref 1.6–2.3)

## 2019-06-27 NOTE — Telephone Encounter (Signed)
Left vm to cb.

## 2019-06-27 NOTE — Telephone Encounter (Signed)
-----   Message from [place], [place] sent at 06/27/2019  8:15 AM EDT ----- Results reviewed. Serum potassium and magnesium levels are within normal limits. Kidney function is relatively at baseline.  Would recommend initiation of cholesterol medications i.e. statin.  Please ask the patient if she is willing to take cholesterol pills at night to improve her lipid profile?  If not, please ask her to follow a cardiac diet and have it rechecked with her primary care providers office.   If she is agreeable to start statin therapy.  Please let me know and I can prescribe her Lipitor 20 mg p.o. nightly.

## 2019-07-04 NOTE — Telephone Encounter (Signed)
Spoke with patient. Patient voiced understanding.

## 2019-07-11 ENCOUNTER — Encounter: Payer: Self-pay | Admitting: Neurology

## 2019-07-11 ENCOUNTER — Other Ambulatory Visit: Payer: Self-pay

## 2019-07-11 ENCOUNTER — Ambulatory Visit (INDEPENDENT_AMBULATORY_CARE_PROVIDER_SITE_OTHER): Payer: Medicare HMO | Admitting: Neurology

## 2019-07-11 VITALS — BP 137/91 | HR 78 | Temp 97.0°F | Ht 66.0 in | Wt 246.0 lb

## 2019-07-11 DIAGNOSIS — J069 Acute upper respiratory infection, unspecified: Secondary | ICD-10-CM | POA: Insufficient documentation

## 2019-07-11 DIAGNOSIS — R0683 Snoring: Secondary | ICD-10-CM

## 2019-07-11 DIAGNOSIS — E7800 Pure hypercholesterolemia, unspecified: Secondary | ICD-10-CM

## 2019-07-11 DIAGNOSIS — I10 Essential (primary) hypertension: Secondary | ICD-10-CM | POA: Insufficient documentation

## 2019-07-11 NOTE — Progress Notes (Signed)
SLEEP MEDICINE CLINIC    Provider:  Larey Seat, MD  Primary Care Physician:  Heywood Bene, PA-C [address] [place] [address]     Referring Provider:   Dr. Rex Kras, DO @ [facility]      Chief Complaint according to patient   Patient presents with:    . New Patient (Initial Visit)     pt alone, Room 10.  This pt states that she has woken herself up snoring. Her dad and brother have OSA. She has poorly controlled BP- cardiologit is concerned.  never had a SS. since she has started a BP medication and she feels tired all the time, and she sleeps sometimes poorly, she has gained a lot of weight, and has to go to the bathroom more.        HISTORY OF PRESENT ILLNESS:  Janice Werner is a 69 Year-old Caucasian  female patient is seen here on 07/11/2019. Chief concern according to patient : " I am here because my cardiologist sent me"   I have the pleasure of seeing Janice Werner on 07-11-2019, a right -handed female with a possible sleep disorder.  She  has amedical history of Osteoarthritis / obesity, DDD, Headache,  Poorly controlled Hypertension, Leg edema, and Malaise and fatigue, and obesity.      Sleep relevant medical history: Nocturia 2 times, Snoring,  Loss of teeth, lower spine surgery, tonsillectomy at age 67.   Family medical /sleep history: father (81)  and brother ( younger) in [place] with OSA, insomnia. Mother died age 73 earlier this year.     Social history:  Patient is retired from bus driving, delivery driving- and lives in a household alone with 7 cats. She lives isolated, stuck at her home, has no car.  Family status is widowed, with  4 adult children, 8 grandchildren, 2 great-grands..  The patientused to work in shifts( Presenter, broadcasting,) as Pharmacist, community.  Tobacco use- none  ETOH use; rare- Caffeine intake in form of Coffee( 8 cups all day ) Soda( pepsi/ a day ) Tea ( none ) . Nor  energy drinks. Regular exercise in form of  walking, yard work. .     Sleep habits are as follows: The patient's breakfast as at 10-11 and dinner time is between 3-4 PM.  The patient goes to bed at 9.30 PM and continues to sleep for 3 hours, wakes for her bathroom breaks. The preferred sleep position is lateral , with the support of 2 pillows.  Dreams are reportedly frequent/vivid nightmares - ever since taking a new medication, Losartan .  6-7AM is the usual rise time. The patient wakes up spontaneously, woken by cats. She reports not feeling refreshed or restored in AM, with symptoms such as dry mouth , rare morning headaches, and residual fatigue.  Naps are taken infrequently.    Review of Systems: Out of a complete 14 system review, the patient complains of only the following symptoms, and all other reviewed systems are negative.:  Fatigue, sleepiness , snoring, fragmented sleep, Insomnia - due to nocturia.  No loss of smell or taste    How likely are you to doze in the following situations: 0 = not likely, 1 = slight chance, 2 = moderate chance, 3 = high chance   Sitting and Reading? Watching Television? Sitting inactive in a public place (theater or meeting)? As a passenger in a car for an hour without a break? Lying down  in the afternoon when circumstances permit? Sitting and talking to someone? Sitting quietly after lunch without alcohol? In a car, while stopped for a few minutes in traffic?   Total = 4/ 24 points   FSS endorsed at 13/ 63 points.   Social History   Socioeconomic History  . Marital status: Single    Spouse name: Not on file  . Number of children: 4  . Years of education: Not on file  . Highest education level: Not on file  Occupational History  . Not on file  Tobacco Use  . Smoking status: Never Smoker  . Smokeless tobacco: Never Used  Substance and Sexual Activity  . Alcohol use: No  . Drug use: No  . Sexual activity: Not on file  Other Topics Concern  . Not on file  Social History  Narrative  . Not on file   Social Determinants of Health   Financial Resource Strain:   . Difficulty of Paying Living Expenses:   Food Insecurity:   . Worried About Programme researcher, broadcasting/film/video in the Last Year:   . Barista in the Last Year:   Transportation Needs:   . Freight forwarder (Medical):   Marland Kitchen Lack of Transportation (Non-Medical):   Physical Activity:   . Days of Exercise per Week:   . Minutes of Exercise per Session:   Stress:   . Feeling of Stress :   Social Connections:   . Frequency of Communication with Friends and Family:   . Frequency of Social Gatherings with Friends and Family:   . Attends Religious Services:   . Active Member of Clubs or Organizations:   . Attends Banker Meetings:   Marland Kitchen Marital Status:     Family History  Problem Relation Age of Onset  . Hypertension Mother   . Alzheimer's disease Mother   . Breast cancer Mother   . Hypertension Father   . Parkinson's disease Father   . Hypertension Brother   . Rheum arthritis Brother     Past Medical History:  Diagnosis Date  . Arthritis   . Headache    migraines  . Hypertension   . Leg edema   . Malaise and fatigue     Past Surgical History:  Procedure Laterality Date  . ABDOMINAL HYSTERECTOMY    . BACK SURGERY     lumbar disc surgery fracture  . bone spur Left    foot  . CHOLECYSTECTOMY    . DILATION AND CURETTAGE OF UTERUS    . JOINT REPLACEMENT Left    Knee replacement  . LEFT OOPHORECTOMY    . TOTAL HIP ARTHROPLASTY Left 12/06/2017  . TOTAL HIP ARTHROPLASTY Left 12/06/2016   Procedure: LEFT TOTAL HIP ARTHROPLASTY ANTERIOR APPROACH;  Surgeon: Sheral Apley, MD;  Location: [facility] OR;  Service: Orthopedics;  Laterality: Left;  . TUBAL LIGATION       Current Outpatient Medications on File Prior to Visit  Medication Sig Dispense Refill  . acetaminophen (TYLENOL) 500 MG tablet Take 2 tablets by mouth daily.    Marland Kitchen amLODipine (NORVASC) 5 MG tablet Take 5 mg by mouth  daily.    Marland Kitchen losartan (COZAAR) 25 MG tablet Take 1 tablet (25 mg total) by mouth at bedtime. 30 tablet 1   No current facility-administered medications on file prior to visit.    Allergies  Allergen Reactions  . Codeine Nausea Only and Other (See Comments)    GI Upset (intolerance) Severe stabbing stomach  pain  . Triazolam Other (See Comments)    Mental Status Changes (intolerance) hallucinations    Physical exam:  Today's Vitals   07/11/19 1507  BP: (!) 137/91  Pulse: 78  Temp: (!) 97 F (36.1 C)  Weight: 246 lb (111.6 kg)  Height: 5\' 6"  (1.676 m)   Body mass index is 39.71 kg/m.   Wt Readings from Last 3 Encounters:  07/11/19 246 lb (111.6 kg)  06/26/19 234 lb (106.1 kg)  06/10/19 234 lb (106.1 kg)     Ht Readings from Last 3 Encounters:  07/11/19 5\' 6"  (1.676 m)  06/26/19 5\' 6"  (1.676 m)  06/10/19 5\' 6"  (1.676 m)      General: The patient is awake, alert and appears not in acute distress. The patient has poor dental status. She has top plate dentures.  Head: Normocephalic, atraumatic. Neck is supple. Mallampati ,  neck circumference:17 inches . Nasal airflow  patent.  Retrognathia is not seen.   Cardiovascular:  Regular rate and cardiac rhythm by pulse,  without distended neck veins. Respiratory: Lungs are clear to auscultation.  Skin:  With= evidence of ankle edema. Trunk: The patient's posture is slightly stooped.    Neurologic exam : The patient is awake and alert, oriented to place and time.   Memory subjective described as intact.  Attention span & concentration ability appears normal.  Speech is fluent,  with some  Dysarthria ( she doesn't wear her dentures) , no dysphonia nor aphasia.  Mood and affect are appropriate.   Cranial nerves: no loss of smell or taste reported  Pupils are equal and briskly reactive to light. Funduscopic exam deferred.  Extraocular movements in vertical and horizontal planes were intact and without nystagmus. No  Diplopia. Visual fields by finger perimetry are intact. Hearing was intact to soft voice and finger rubbing.    Facial sensation intact to fine touch.  Facial motor strength is symmetric and tongue and uvula move midline.  Neck ROM : rotation, tilt and flexion extension were normal for age and shoulder shrug was symmetrical.    Motor exam:  Symmetric bulk, tone and ROM in shoulder and hip- . Right knee pain limited ROM.    Normal tone without cog wheeling, but weak symmetric grip strength- total atrophy of the thenar eminence in  the left hand- she is right hand dominant.   Sensory:  Fine touch, pinprick and vibration were normal.  She reports 3 numb fingers on the left hand - thumb, index and middle finger. Carpal tunnel.   Proprioception tested in the upper extremities was normal.   Coordination: Rapid alternating movements in the fingers/hands were of normal speed.  The Finger-to-nose maneuver was intact without evidence of ataxia, dysmetria or tremor.   Gait and station: Patient could rise unassisted from a seated position, walked without assistive device. Toe and heel walk were deferred.  Deep tendon reflexes: in the  upper and lower extremities are symmetric (patella was  2 pus- brisk bilaterally ) .  Babinski response was deferred.      After spending a total time of 47 minutes face to face and  for physical and neurologic examination, review of laboratory studies,  personal review of imaging studies, reports and results of other testing and review of referral information / records as far as provided in visit, I have established the following assessments:  The patient had COVID like symptoms in January 2021 and will not take the COVID vaccine.   1)  Patient  with high risk factors for OSA, based on BMI 39 kg/m2, upper airway anatomy , large neck and proximal bulk.   2)  Ankle edema.  3) uncontrolled HTN.    My Plan is to proceed with:  1) HST or attended sleep study- both will  suffice.    I would like to thank Dr. Gardiner Coins, DO ,for allowing me to meet with and to take care of this pleasant patient.   I plan to follow up  through our NP within 2-3  month.   CC: I will share my notes with PCP . Electronically signed by: Melvyn Novas, MD 07/11/2019 3:28 PM  [facility] Board certified by The [employer] of Sleep Medicine and Diplomate of the [employer] of Sleep Medicine. Board certified In Neurology through the ABPN, Fellow of the [employer] of Neurology. Medical Director of [facility].

## 2019-07-11 NOTE — Patient Instructions (Signed)

## 2019-07-12 LAB — SAR COV2 SEROLOGY (COVID19)AB(IGG),IA: DiaSorin SARS-CoV-2 Ab, IgG: NEGATIVE

## 2019-07-14 NOTE — Progress Notes (Signed)
Negative active Covid test , CD  CC: PCP NOT FOUND.

## 2019-07-31 ENCOUNTER — Telehealth: Payer: Self-pay

## 2019-07-31 NOTE — Telephone Encounter (Signed)
LVM for pt to call me back to schedule sleep study  

## 2019-09-18 ENCOUNTER — Ambulatory Visit (INDEPENDENT_AMBULATORY_CARE_PROVIDER_SITE_OTHER): Payer: Medicare HMO | Admitting: Neurology

## 2019-09-18 DIAGNOSIS — I10 Essential (primary) hypertension: Secondary | ICD-10-CM

## 2019-09-18 DIAGNOSIS — R0683 Snoring: Secondary | ICD-10-CM

## 2019-09-18 DIAGNOSIS — R6889 Other general symptoms and signs: Secondary | ICD-10-CM

## 2019-09-18 DIAGNOSIS — G4733 Obstructive sleep apnea (adult) (pediatric): Secondary | ICD-10-CM | POA: Diagnosis not present

## 2019-09-18 DIAGNOSIS — E7800 Pure hypercholesterolemia, unspecified: Secondary | ICD-10-CM

## 2019-09-25 ENCOUNTER — Other Ambulatory Visit: Payer: Self-pay

## 2019-09-25 ENCOUNTER — Ambulatory Visit: Payer: Medicare HMO | Admitting: Cardiology

## 2019-09-25 ENCOUNTER — Encounter: Payer: Self-pay | Admitting: Cardiology

## 2019-09-25 VITALS — BP 165/84 | HR 85 | Ht 66.0 in | Wt 254.0 lb

## 2019-09-25 DIAGNOSIS — E6609 Other obesity due to excess calories: Secondary | ICD-10-CM

## 2019-09-25 DIAGNOSIS — I10 Essential (primary) hypertension: Secondary | ICD-10-CM

## 2019-09-25 DIAGNOSIS — E782 Mixed hyperlipidemia: Secondary | ICD-10-CM

## 2019-09-25 MED ORDER — AMLODIPINE BESYLATE 10 MG PO TABS: 10.0000 mg | ORAL_TABLET | Freq: Every evening | ORAL | 1 refills | Status: AC

## 2019-09-25 NOTE — Progress Notes (Signed)
Janice Werner Date of Birth: Aug 02, 1950 MRN: [id number] Primary Care Provider:Williams, Karis Juba, PA-C Former Cardiology Providers: Altamese [place], APRN, FNP-C Primary Cardiologist: Tessa Lerner, DO, [facility] (established care June 10, 2019)    Date: 09/25/19 Last Office Visit: 06/26/2019  Chief Complaint  Patient presents with  . Hypertension  . Follow-up    3 month    HPI  Janice Werner is a 69 y.o. female who presents to the office with a chief complaint of "hypertension follow-up." Patient's past medical history and cardiac risk factors include: Hypertension, hyperlipidemia, obesity due to excess calories, postmenopausal female, advanced age.  Since last office visit patient has not been hospitalized or seen in urgent care for cardiovascular symptoms.  In regards to hypertension her systolic blood pressures are still not well controlled this is most likely due to compliance.  Please refer to prior office notes for additional details.  Patient was on Norvasc and losartan as of the last visit.  However, patient stopped taking losartan because it causes her to be tired and fatigue and she would like it to be listed as one of the allergies.  Reviewed her blood pressure log with the patient.  Despite letting her know that her blood pressure is improving but not at goal patient denies this fact.  She states that her systolic blood pressures at home are around 140 - 150 mmHg and thats what she considers " normal blood pressure."  Despite multiple efforts to educate her that her goal systolic blood pressure should be around 130 mmHg or lower if tolerable. She refuses to uptitrate antihypertensive medications.  Patient states that she is only willing to increase her amlodipine to 10 mg p.o. daily but refuses to be on any additional antihypertensive medications.  In the interim, patient was also started on statin therapy but she refuses to take them as well.  She states " all statin  medications make her feel sick." I have asked her to possibly consider nonstatin medications and to review this with her primary provider.   ALLERGIES: Allergies  Allergen Reactions  . Codeine Nausea Only and Other (See Comments)    GI Upset (intolerance) Severe stabbing stomach pain  . Triazolam Other (See Comments)    Mental Status Changes (intolerance) hallucinations  . Losartan     Refuses to take as it causes tiredness and fatigue.    MEDICATION LIST PRIOR TO VISIT: Current Outpatient Medications on File Prior to Visit  Medication Sig Dispense Refill  . acetaminophen (TYLENOL) 500 MG tablet Take 2 tablets by mouth daily.     No current facility-administered medications on file prior to visit.    PAST MEDICAL HISTORY: Past Medical History:  Diagnosis Date  . Arthritis   . Headache    migraines  . Hypertension   . Leg edema   . Malaise and fatigue     PAST SURGICAL HISTORY: Past Surgical History:  Procedure Laterality Date  . ABDOMINAL HYSTERECTOMY    . BACK SURGERY     lumbar disc surgery fracture  . bone spur Left    foot  . CHOLECYSTECTOMY    . DILATION AND CURETTAGE OF UTERUS    . JOINT REPLACEMENT Left    Knee replacement  . LEFT OOPHORECTOMY    . TOTAL HIP ARTHROPLASTY Left 12/06/2017  . TOTAL HIP ARTHROPLASTY Left 12/06/2016   Procedure: LEFT TOTAL HIP ARTHROPLASTY ANTERIOR APPROACH;  Surgeon: Sheral Apley, MD;  Location: [facility] OR;  Service: Orthopedics;  Laterality: Left;  .  TUBAL LIGATION      FAMILY HISTORY: The patient family history includes Alzheimer's disease in her mother; Breast cancer in her mother; Hypertension in her brother, father, and mother; Parkinson's disease in her father; Rheum arthritis in her brother.   SOCIAL HISTORY:  The patient  reports that she has never smoked. She has never used smokeless tobacco. She reports that she does not drink alcohol and does not use drugs.  Review of Systems  Constitutional: Positive for  malaise/fatigue. Negative for chills and fever.  HENT: Negative for hoarse voice and nosebleeds.   Eyes: Negative for discharge, double vision and pain.  Cardiovascular: Negative for chest pain, claudication, dyspnea on exertion, leg swelling, near-syncope, orthopnea, palpitations, paroxysmal nocturnal dyspnea and syncope.  Respiratory: Negative for hemoptysis and shortness of breath.   Musculoskeletal: Negative for muscle cramps and myalgias.  Gastrointestinal: Negative for abdominal pain, constipation, diarrhea, hematemesis, hematochezia, melena, nausea and vomiting.  Neurological: Negative for dizziness and light-headedness.   PHYSICAL EXAM: Vitals with BMI 09/25/2019 07/11/2019 06/26/2019  Height 5\' 6"  5\' 6"  5\' 6"   Weight 254 lbs 246 lbs 234 lbs  BMI 41.02 39.72 37.79  Systolic 165 137  Diastolic 84 91 85  Pulse 85 78 96   CONSTITUTIONAL: Well-developed and well-nourished. No acute distress.  SKIN: Skin is warm and dry. No rash noted. No cyanosis. No pallor. No jaundice HEAD: Normocephalic and atraumatic.  EYES: No scleral icterus MOUTH/THROAT: Moist oral membranes.  NECK: No JVD present. No thyromegaly noted. No carotid bruits  LYMPHATIC: No visible cervical adenopathy.  CHEST Normal respiratory effort. No intercostal retractions  LUNGS: Clear to auscultation bilaterally.  No stridor. No wheezes. No rales.  CARDIOVASCULAR: Regular rate and rhythm, positive S1-S2, soft holosystolic murmur heard at the left lower sternal border, no gallops or rubs. ABDOMINAL: Obese, soft, nontender, nondistended, positive bowel sounds in all 4 quadrants, no apparent ascites.  EXTREMITIES: No peripheral edema  HEMATOLOGIC: No significant bruising NEUROLOGIC: Oriented to person, place, and time. Nonfocal. Normal muscle tone.  PSYCHIATRIC: Normal mood and affect. Normal behavior. Cooperative  EKG 05/30/2018: Normal sinus rhythm at 75 bpm, normal axis, 1 PVC, nonspecific T wave abnormality. No changes  from EKG 05/09/2017  Lower extremity venous duplex 05/18/2017: No evidence of deep vein thrombosis of the left lower extremity with normal venous return.  Echocardiogram 11/25/2016: Left ventricle cavity is normal in size. Mild concentric hypertrophy of the left ventricle. Normal global wall motion. Indeterminate diastolic filling pattern. Calculated EF 61%. Left atrial cavity is mildly dilated. Mild (Grade I) mitral regurgitation. Mild to moderate tricuspid regurgitation. Pulmonary artery systolic pressure is estimated at 28-33 mm Hg.  Lexiscan myoview stress test 11/25/2016: 1. The resting electrocardiogram demonstrated normal sinus rhythm and normal resting conduction. Poor R wave progression. Stress EKG is non diagnostic for ischemia as it is a pharmacologic stress using Lexiscan. Stress symptoms included dyspnea, dizziness. 2. The RAW images reveal breast tissue attenuation in the inferior and septal region. Superimposed small sized moderate ischemia in the mid anteroseptal and apical septal myocardial wall(s) cannot be excluded. Gated SPECT images reveal normal myocardial thickening and wall motion. The left ventricular ejection fraction was calculated or visually estimated to be 56%. This is a low risk study. Clinical correlation recommended in a patient with BMI 38.  LABORATORY DATA: CBC Latest Ref Rng & Units 11/25/2016  WBC 4.0 - 10.5 K/uL 6.5  Hemoglobin 12.0 - 15.0 g/dL 01/25/2017  Hematocrit 36 - 46 % 42.7  Platelets 150 - 400  K/uL 264    CMP Latest Ref Rng & Units 06/26/2019 11/25/2016  Glucose 65 - 99 mg/dL 90 93  BUN 8 - 27 mg/dL 23 18  Creatinine 1.610.57 - 1.00 mg/dL 0.960.98 0.450.83  Sodium 409134 - 144 mmol/L 142 142  Potassium 3.5 - 5.2 mmol/L 4.5 3.8  Chloride 96 - 106 mmol/L 106 109  CO2 20 - 29 mmol/L 23 24  Calcium 8.7 - 10.3 mg/dL 9.5 9.0    FINAL MEDICATION LIST END OF ENCOUNTER: Meds ordered this encounter  Medications  . amLODipine (NORVASC) 10 MG tablet    Sig: Take 1 tablet (10  mg total) by mouth every evening.    Dispense:  90 tablet    Refill:  1      Current Outpatient Medications:  .  acetaminophen (TYLENOL) 500 MG tablet, Take 2 tablets by mouth daily., Disp: , Rfl:  .  amLODipine (NORVASC) 10 MG tablet, Take 1 tablet (10 mg total) by mouth every evening., Disp: 90 tablet, Rfl: 1  IMPRESSION:    ICD-10-CM   1. Essential hypertension  I10 amLODipine (NORVASC) 10 MG tablet  2. Mixed hyperlipidemia  E78.2   3. Moderate obesity  E66.8   4. Mild hyperlipidemia  E78.5   5. Class 2 obesity due to excess calories without serious comorbidity with body mass index (BMI) of 37.0 to 37.9 in adult  E66.09    Z68.37      RECOMMENDATIONS: Janice Werner is a 69 y.o. female whose past medical history and cardiac risk factors include: Hypertension, hyperlipidemia, postmenopausal female, advanced age.   Benign essential hypertension:   Patient's home blood pressures have improved since last office visit.  However, currently not at goal.  Improving her blood pressures have been challenging as she refuses to uptitrate her antihypertensive medications for reasons noted above.    She is agreeable to increase amlodipine to 10 mg p.o. daily.    Educated on low-salt diet and increasing physical activity as tolerated with a goal of 30 minutes a day 5 days a week.    We will continue to monitor her blood pressures via principal care management. If the blood pressure is consistently greater than 140mmHg patient is asked to call the office or her primary care provider's office for medication titration sooner than the next office visit.   Mixed hyperlipidemia:  Patient refuses to be on statin therapy despite educating her on a different intensities statin drugs.  She also refuses to be on nonstatin medications.  Educated on lifestyle modifications and dietary changes.  Patient would like to follow-up with her PCP.  Obesity, due to excess calories: . Body mass index  is 41 kg/m. . I reviewed with the patient the importance of diet, regular physical activity/exercise, weight loss.   . Patient is educated on increasing physical activity gradually as tolerated.  With the goal of moderate intensity exercise for 30 minutes a day 5 days a week.   No orders of the defined types were placed in this encounter.  --Continue cardiac medications as reconciled in final medication list. --Return in about 6 months (around 03/27/2020) for BP follow up . Marland Kitchen. Or sooner if needed. --Continue follow-up with your primary care physician regarding the management of your other chronic comorbid conditions.  Patient's questions and concerns were addressed to her satisfaction. She voices understanding of the instructions provided during this encounter.   This note was created using a voice recognition software as a result there may be grammatical  errors inadvertently enclosed that do not reflect the nature of this encounter. Every attempt is made to correct such errors.  Tessa Lerner, DO, [facility]. [facility] Office: [phone]

## 2019-09-26 NOTE — Progress Notes (Signed)
Thank-you for the update and coordinating her care.

## 2019-09-26 NOTE — Progress Notes (Signed)
Summary & Diagnosis:   There is brady-tachycardia noted. Mild sleep apnea at 13.9/h AHI  without REM sleep accentuation. There was no clinically relevant  period of hypoxia.  This mild apnea can be addressed with dental device or CPAP or  Inspire device.   Recommendations:     I am leaning to recommend an autotitration CPAP device as first  treatment, should this not be tolerated well, will change to  dental device.  Auto CPAP at 5-15 cm water pressure 2 cm EPR and mask of  patient's choice.  Interpreting Physician: Melvyn Novas, MD

## 2019-09-26 NOTE — Procedures (Signed)
  Patient Information     First Name: Janice Werner Last Name: [name]: [id number]  Birth Date: March 20, 1951 Age:     69 Gender: Female  Referring Provider: Tessa Lerner, DO BMI: 39.7 (W=247 lbs, H=5' 6'')  Neck Circ.:  17 '' Epworth:  4/24   Sleep Study Information    Study Date: 09/18/19 S/H/A Version: [id number] / [id number] / 77  History:    Landry Dyke is a right -handed female with a medical history of Osteoarthritis / obesity, DDD, Headache, Poorly controlled Hypertension, Leg edema, and Malaise and fatigue, and obesity. Patient reports been woken by her own snoring. She used to work as a IT trainer and reports vivid nightmares. She is fatigued but not sleepy.        Summary & Diagnosis:    There is brady-tachycardia noted. Mild sleep apnea at 13.9/h AHI without REM sleep accentuation. There was no clinically relevant period of hypoxia.  This mild apnea can be addressed with dental device or CPAP or Inspire device.   Recommendations:      I am leaning to recommend an autotitration CPAP device as first treatment, should this not be tolerated well, will change to dental device.  Auto CPAP at 5-15 cm water pressure 2 cm EPR and mask of patient's choice.  Interpreting Physician: Melvyn Novas, MD              Sleep Summary  Oxygen Saturation Statistics   Start Study Time: End Study Time: Total Recording Time:  10:02:56PM 6:44:37 AM 8 h,  Total Sleep Time % REM of Sleep Time:  6 h, 47 min  22.1    Mean: 93 Minimum: 88 Maximum: 98  Mean of Desaturations Nadirs (%):   92  Oxygen Desaturation. %: 4-9 10-20 >20 Total  Events Number Total  29 100.0  0 0.0  0 0.0  29 100.0  Oxygen Saturation: <90 <=88 <85 <80 <70  Duration (minutes): Sleep % 0.0 0.0 0.0 0.0 0.0 0.0 0.0 0.0 0.0 0.0     Respiratory Indices      Total Events REM NREM All Night  pRDI:  136  pAHI:  92 ODI:  29  pAHIc:  16  % CSR: 0.0 10.7 8.7 0.7 3.3 22.8 15.0 5.3 2.1  20.1 13.6 4.3 2.4       Pulse Rate Statistics during Sleep (BPM)      Mean:  74 Minimum: 38 Maximum: 103    Indices are calculated using technically valid sleep time of 6 h, 45 min. pRDI/pAHI are calculated using 02 desaturations ? 3% Sit  Body Position Statistics  Position Supine Prone Right Left Non-Supine  Sleep (min) 338.2 31.0 0.0 38.5 69.5  Sleep % 82.9 7.6 0.0 9.4 17.1  pRDI 16.4 42.9 N/A 34.7 38.4  pAHI 10.5 25.3 N/A 31.6 28.8  ODI 2.7 15.6 N/A 9.5 12.2     Snoring Statistics Snoring Level (dB) >40 >50 >60 >70 >80 >Threshold (45)  Sleep (min) 103.8 11.3 3.5 0.0 0.0 23.7  Sleep % 25.5 2.8 0.9 0.0 0.0 5.8    Mean: 41 dB

## 2019-09-26 NOTE — Addendum Note (Signed)
Addended by: Melvyn Novas on: 09/26/2019 02:52 PM   Modules accepted: Orders

## 2019-09-27 ENCOUNTER — Telehealth: Payer: Self-pay | Admitting: Neurology

## 2019-09-27 NOTE — Telephone Encounter (Signed)
-----   Message from Melvyn Novas, MD sent at 09/26/2019  2:52 PM EDT ----- Summary & Diagnosis:   There is brady-tachycardia noted. Mild sleep apnea at 13.9/h AHI  without REM sleep accentuation. There was no clinically relevant  period of hypoxia.  This mild apnea can be addressed with dental device or CPAP or  Inspire device.   Recommendations:     I am leaning to recommend an autotitration CPAP device as first  treatment, should this not be tolerated well, will change to  dental device.  Auto CPAP at 5-15 cm water pressure 2 cm EPR and mask of  patient's choice.  Interpreting Physician: Melvyn Novas, MD

## 2019-09-27 NOTE — Telephone Encounter (Signed)
Called patient to discuss sleep study results. No answer at this time. LVM for the patient to call back.   

## 2019-10-03 NOTE — Telephone Encounter (Signed)
Pt returned call. Please call back when available. 

## 2019-10-03 NOTE — Telephone Encounter (Signed)
I called pt. I advised pt that Dr. Vickey Huger reviewed their sleep study results and found that pt has mild sleep apnea. Dr. Vickey Huger recommends that pt starts auto CPAP. I reviewed PAP compliance expectations with the pt. Pt is agreeable to starting a CPAP. I advised pt that an order will be sent to a DME, [facility] and [facility] will call the pt within about one week after they file with the pt's insurance. [facility]) will show the pt how to use the machine, fit for masks, and troubleshoot the CPAP if needed. A follow up appt was made for insurance purposes with Dr. Vickey Huger on sept 21,2021 at 2:30 pm. Pt verbalized understanding to arrive 15 minutes early and bring their CPAP. A letter with all of this information in it will be mailed to the pt as a reminder. I verified with the pt that the address we have on file is correct. Pt verbalized understanding of results. Pt had no questions at this time but was encouraged to call back if questions arise. I have sent the order to [facility]) and have received confirmation that they have received the order.

## 2019-10-03 NOTE — Telephone Encounter (Signed)
Made a 2nd attempt to contact the patient to review sleep study results. LVM for pt to return call.

## 2019-10-03 NOTE — Telephone Encounter (Signed)
Called the patient back. No answer left another message.

## 2019-10-08 ENCOUNTER — Encounter: Payer: Medicare HMO | Admitting: Family Medicine

## 2019-10-08 DIAGNOSIS — G4733 Obstructive sleep apnea (adult) (pediatric): Secondary | ICD-10-CM

## 2019-10-08 NOTE — Progress Notes (Signed)
Our office attempted to call Mrs. Janice Werner yesterday afternoon.  A message was left stating that the appointment scheduled for today needed to be rescheduled as she has not been set up on CPAP therapy at this time.  Patient reports that she was unable to understand the message.  Patient came in today for her scheduled appointment at 1130.  I have explained to her the need to reschedule this appointment.  I have given her detailed, written instructions including the contact number for area care ([facility]) and instructed her to call as soon as possible to have CPAP therapy initiated.  Once CPAP therapy is set up, she was advised to call our office.  Contact information provided.  Appointment needed for compliance review within 31-90 days following CPAP initiation. She verbalizes understanding and agreement with plan.

## 2019-11-19 ENCOUNTER — Telehealth: Payer: Self-pay

## 2019-11-19 NOTE — Telephone Encounter (Signed)
NOTES ON FILE FROM [facility] [phone], SENT NOTES TO

## 2019-12-02 ENCOUNTER — Encounter: Payer: Self-pay | Admitting: Physician Assistant

## 2019-12-09 ENCOUNTER — Emergency Department (HOSPITAL_COMMUNITY): Payer: Medicare HMO

## 2019-12-09 ENCOUNTER — Inpatient Hospital Stay (HOSPITAL_COMMUNITY)
Admission: EM | Admit: 2019-12-09 | Discharge: 2019-12-20 | DRG: 871 | Disposition: E | Payer: Medicare HMO | Attending: Internal Medicine | Admitting: Internal Medicine

## 2019-12-09 DIAGNOSIS — R7989 Other specified abnormal findings of blood chemistry: Secondary | ICD-10-CM | POA: Diagnosis not present

## 2019-12-09 DIAGNOSIS — I248 Other forms of acute ischemic heart disease: Secondary | ICD-10-CM | POA: Diagnosis present

## 2019-12-09 DIAGNOSIS — Z6838 Body mass index (BMI) 38.0-38.9, adult: Secondary | ICD-10-CM

## 2019-12-09 DIAGNOSIS — Z9049 Acquired absence of other specified parts of digestive tract: Secondary | ICD-10-CM

## 2019-12-09 DIAGNOSIS — G92 Toxic encephalopathy: Secondary | ICD-10-CM | POA: Diagnosis present

## 2019-12-09 DIAGNOSIS — G9341 Metabolic encephalopathy: Secondary | ICD-10-CM

## 2019-12-09 DIAGNOSIS — R778 Other specified abnormalities of plasma proteins: Secondary | ICD-10-CM

## 2019-12-09 DIAGNOSIS — Z8261 Family history of arthritis: Secondary | ICD-10-CM

## 2019-12-09 DIAGNOSIS — Z820 Family history of epilepsy and other diseases of the nervous system: Secondary | ICD-10-CM

## 2019-12-09 DIAGNOSIS — I82431 Acute embolism and thrombosis of right popliteal vein: Secondary | ICD-10-CM | POA: Diagnosis present

## 2019-12-09 DIAGNOSIS — E860 Dehydration: Secondary | ICD-10-CM | POA: Diagnosis present

## 2019-12-09 DIAGNOSIS — Z8249 Family history of ischemic heart disease and other diseases of the circulatory system: Secondary | ICD-10-CM

## 2019-12-09 DIAGNOSIS — J9601 Acute respiratory failure with hypoxia: Secondary | ICD-10-CM | POA: Diagnosis present

## 2019-12-09 DIAGNOSIS — I82403 Acute embolism and thrombosis of unspecified deep veins of lower extremity, bilateral: Secondary | ICD-10-CM

## 2019-12-09 DIAGNOSIS — U071 COVID-19: Secondary | ICD-10-CM | POA: Diagnosis present

## 2019-12-09 DIAGNOSIS — Z79899 Other long term (current) drug therapy: Secondary | ICD-10-CM

## 2019-12-09 DIAGNOSIS — M1990 Unspecified osteoarthritis, unspecified site: Secondary | ICD-10-CM | POA: Diagnosis present

## 2019-12-09 DIAGNOSIS — I129 Hypertensive chronic kidney disease with stage 1 through stage 4 chronic kidney disease, or unspecified chronic kidney disease: Secondary | ICD-10-CM | POA: Diagnosis present

## 2019-12-09 DIAGNOSIS — Z515 Encounter for palliative care: Secondary | ICD-10-CM

## 2019-12-09 DIAGNOSIS — N179 Acute kidney failure, unspecified: Secondary | ICD-10-CM | POA: Diagnosis present

## 2019-12-09 DIAGNOSIS — R4182 Altered mental status, unspecified: Secondary | ICD-10-CM | POA: Diagnosis present

## 2019-12-09 DIAGNOSIS — R195 Other fecal abnormalities: Secondary | ICD-10-CM

## 2019-12-09 DIAGNOSIS — A4189 Other specified sepsis: Principal | ICD-10-CM

## 2019-12-09 DIAGNOSIS — N183 Chronic kidney disease, stage 3 unspecified: Secondary | ICD-10-CM

## 2019-12-09 DIAGNOSIS — Z96642 Presence of left artificial hip joint: Secondary | ICD-10-CM | POA: Diagnosis present

## 2019-12-09 DIAGNOSIS — J1282 Pneumonia due to coronavirus disease 2019: Secondary | ICD-10-CM | POA: Diagnosis present

## 2019-12-09 DIAGNOSIS — Z803 Family history of malignant neoplasm of breast: Secondary | ICD-10-CM

## 2019-12-09 DIAGNOSIS — Z90721 Acquired absence of ovaries, unilateral: Secondary | ICD-10-CM

## 2019-12-09 DIAGNOSIS — Z888 Allergy status to other drugs, medicaments and biological substances status: Secondary | ICD-10-CM

## 2019-12-09 DIAGNOSIS — Z90710 Acquired absence of both cervix and uterus: Secondary | ICD-10-CM

## 2019-12-09 DIAGNOSIS — I10 Essential (primary) hypertension: Secondary | ICD-10-CM | POA: Diagnosis present

## 2019-12-09 DIAGNOSIS — Z66 Do not resuscitate: Secondary | ICD-10-CM | POA: Diagnosis present

## 2019-12-09 DIAGNOSIS — Z885 Allergy status to narcotic agent status: Secondary | ICD-10-CM | POA: Diagnosis not present

## 2019-12-09 DIAGNOSIS — E7800 Pure hypercholesterolemia, unspecified: Secondary | ICD-10-CM | POA: Diagnosis present

## 2019-12-09 LAB — CBC WITH DIFFERENTIAL/PLATELET
Abs Immature Granulocytes: 0.1 10*3/uL — ABNORMAL HIGH (ref 0.00–0.07)
Basophils Absolute: 0 10*3/uL (ref 0.0–0.1)
Basophils Relative: 0 %
Eosinophils Absolute: 0 10*3/uL (ref 0.0–0.5)
Eosinophils Relative: 0 %
HCT: 47.9 % — ABNORMAL HIGH (ref 36.0–46.0)
Hemoglobin: 15 g/dL (ref 12.0–15.0)
Lymphocytes Relative: 13 %
Lymphs Abs: 1 10*3/uL (ref 0.7–4.0)
MCH: 28.5 pg (ref 26.0–34.0)
MCHC: 31.3 g/dL (ref 30.0–36.0)
MCV: 90.9 fL (ref 80.0–100.0)
Metamyelocytes Relative: 1 %
Monocytes Absolute: 0.2 10*3/uL (ref 0.1–1.0)
Monocytes Relative: 2 %
Neutro Abs: 6.6 10*3/uL (ref 1.7–7.7)
Neutrophils Relative %: 84 %
Platelets: 282 10*3/uL (ref 150–400)
RBC: 5.27 MIL/uL — ABNORMAL HIGH (ref 3.87–5.11)
RDW: 14.6 % (ref 11.5–15.5)
WBC: 7.9 10*3/uL (ref 4.0–10.5)
nRBC: 0.3 % — ABNORMAL HIGH (ref 0.0–0.2)

## 2019-12-09 LAB — COMPREHENSIVE METABOLIC PANEL
ALT: 21 U/L (ref 0–44)
AST: 48 U/L — ABNORMAL HIGH (ref 15–41)
Albumin: 3 g/dL — ABNORMAL LOW (ref 3.5–5.0)
Alkaline Phosphatase: 71 U/L (ref 38–126)
Anion gap: 14 (ref 5–15)
BUN: 33 mg/dL — ABNORMAL HIGH (ref 8–23)
CO2: 25 mmol/L (ref 22–32)
Calcium: 8.3 mg/dL — ABNORMAL LOW (ref 8.9–10.3)
Chloride: 105 mmol/L (ref 98–111)
Creatinine, Ser: 1.31 mg/dL — ABNORMAL HIGH (ref 0.44–1.00)
GFR calc Af Amer: 48 mL/min — ABNORMAL LOW (ref >60)
GFR calc non Af Amer: 41 mL/min — ABNORMAL LOW (ref >60)
Glucose, Bld: 116 mg/dL — ABNORMAL HIGH (ref 70–99)
Potassium: 3.6 mmol/L (ref 3.5–5.1)
Sodium: 144 mmol/L (ref 135–145)
Total Bilirubin: 0.9 mg/dL (ref 0.3–1.2)
Total Protein: 7.1 g/dL (ref 6.5–8.1)

## 2019-12-09 LAB — PROTIME-INR
INR: 1.2 (ref 0.8–1.2)
Prothrombin Time: 14.6 seconds (ref 11.4–15.2)

## 2019-12-09 LAB — I-STAT VENOUS BLOOD GAS, ED
Acid-Base Excess: 3 mmol/L — ABNORMAL HIGH (ref 0.0–2.0)
Bicarbonate: 27.3 mmol/L (ref 20.0–28.0)
Calcium, Ion: 0.98 mmol/L — ABNORMAL LOW (ref 1.15–1.40)
HCT: 45 % (ref 36.0–46.0)
Hemoglobin: 15.3 g/dL — ABNORMAL HIGH (ref 12.0–15.0)
O2 Saturation: 61 %
Potassium: 3.5 mmol/L (ref 3.5–5.1)
Sodium: 144 mmol/L (ref 135–145)
TCO2: 29 mmol/L (ref 22–32)
pCO2, Ven: 40.1 mmHg — ABNORMAL LOW (ref 44.0–60.0)
pH, Ven: 7.442 — ABNORMAL HIGH (ref 7.250–7.430)
pO2, Ven: 31 mmHg — CL (ref 32.0–45.0)

## 2019-12-09 LAB — TROPONIN I (HIGH SENSITIVITY)
Troponin I (High Sensitivity): 280 ng/L (ref <18)
Troponin I (High Sensitivity): 185 ng/L (ref <18)

## 2019-12-09 LAB — CREATININE, SERUM
Creatinine, Ser: 1.23 mg/dL — ABNORMAL HIGH (ref 0.44–1.00)
GFR calc Af Amer: 52 mL/min — ABNORMAL LOW (ref >60)
GFR calc non Af Amer: 45 mL/min — ABNORMAL LOW (ref >60)

## 2019-12-09 LAB — I-STAT CHEM 8, ED
BUN: 37 mg/dL — ABNORMAL HIGH (ref 8–23)
Calcium, Ion: 1.01 mmol/L — ABNORMAL LOW (ref 1.15–1.40)
Chloride: 107 mmol/L (ref 98–111)
Creatinine, Ser: 1.3 mg/dL — ABNORMAL HIGH (ref 0.44–1.00)
Glucose, Bld: 114 mg/dL — ABNORMAL HIGH (ref 70–99)
HCT: 44 % (ref 36.0–46.0)
Hemoglobin: 15 g/dL (ref 12.0–15.0)
Potassium: 3.6 mmol/L (ref 3.5–5.1)
Sodium: 144 mmol/L (ref 135–145)
TCO2: 26 mmol/L (ref 22–32)

## 2019-12-09 LAB — TYPE AND SCREEN
ABO/RH(D): A POS
Antibody Screen: NEGATIVE

## 2019-12-09 LAB — CK: Total CK: 239 U/L — ABNORMAL HIGH (ref 38–234)

## 2019-12-09 LAB — POC SARS CORONAVIRUS 2 AG -  ED: SARS Coronavirus 2 Ag: NEGATIVE

## 2019-12-09 LAB — PROCALCITONIN: Procalcitonin: 0.22 ng/mL

## 2019-12-09 LAB — LACTIC ACID, PLASMA
Lactic Acid, Venous: 2.3 mmol/L (ref 0.5–1.9)
Lactic Acid, Venous: 2.2 mmol/L (ref 0.5–1.9)

## 2019-12-09 LAB — MAGNESIUM: Magnesium: 3 mg/dL — ABNORMAL HIGH (ref 1.7–2.4)

## 2019-12-09 LAB — SARS CORONAVIRUS 2 BY RT PCR (HOSPITAL ORDER, PERFORMED IN ~~LOC~~ HOSPITAL LAB): SARS Coronavirus 2: POSITIVE — AB

## 2019-12-09 LAB — APTT: aPTT: 26 seconds (ref 24–36)

## 2019-12-09 LAB — LACTATE DEHYDROGENASE: LDH: 735 U/L — ABNORMAL HIGH (ref 98–192)

## 2019-12-09 LAB — CBG MONITORING, ED: Glucose-Capillary: 110 mg/dL — ABNORMAL HIGH (ref 70–99)

## 2019-12-09 LAB — POC OCCULT BLOOD, ED: Fecal Occult Bld: POSITIVE — AB

## 2019-12-09 LAB — LIPASE, BLOOD: Lipase: 26 U/L (ref 11–51)

## 2019-12-09 MED ORDER — SODIUM CHLORIDE 0.9 % IV SOLN
500.0000 mg | INTRAVENOUS | Status: DC
  Administered 2019-12-09: 500 mg via INTRAVENOUS
  Filled 2019-12-09: qty 500

## 2019-12-09 MED ORDER — ENOXAPARIN SODIUM 40 MG/0.4ML ~~LOC~~ SOLN
40.0000 mg | SUBCUTANEOUS | Status: DC
  Filled 2019-12-09: qty 0.4

## 2019-12-09 MED ORDER — ZINC SULFATE 220 (50 ZN) MG PO CAPS
220.0000 mg | ORAL_CAPSULE | Freq: Every day | ORAL | Status: DC
  Administered 2019-12-09 – 2019-12-10 (×2): 220 mg via ORAL
  Filled 2019-12-09 (×2): qty 1

## 2019-12-09 MED ORDER — METHYLPREDNISOLONE SODIUM SUCC 125 MG IJ SOLR: 1.0000 mg/kg | Freq: Two times a day (BID) | INTRAMUSCULAR | Status: DC

## 2019-12-09 MED ORDER — ACETAMINOPHEN 650 MG RE SUPP
650.0000 mg | Freq: Once | RECTAL | Status: AC
  Administered 2019-12-09: 650 mg via RECTAL
  Filled 2019-12-09: qty 1

## 2019-12-09 MED ORDER — LACTATED RINGERS IV SOLN: INTRAVENOUS | Status: DC

## 2019-12-09 MED ORDER — SODIUM CHLORIDE 0.9 % IV SOLN
200.0000 mg | Freq: Once | INTRAVENOUS | Status: AC
  Administered 2019-12-09: 200 mg via INTRAVENOUS
  Filled 2019-12-09: qty 40

## 2019-12-09 MED ORDER — ONDANSETRON HCL 4 MG PO TABS: 4.0000 mg | ORAL_TABLET | Freq: Four times a day (QID) | ORAL | Status: DC | PRN

## 2019-12-09 MED ORDER — ACETAMINOPHEN 325 MG PO TABS
650.0000 mg | ORAL_TABLET | Freq: Four times a day (QID) | ORAL | Status: DC | PRN
  Administered 2019-12-10: 650 mg via ORAL
  Filled 2019-12-09: qty 2

## 2019-12-09 MED ORDER — LORAZEPAM 2 MG/ML IJ SOLN: 1.0000 mg | Freq: Once | INTRAMUSCULAR | Status: AC

## 2019-12-09 MED ORDER — LORAZEPAM 2 MG/ML IJ SOLN
INTRAMUSCULAR | Status: AC
  Administered 2019-12-09: 1 mg via INTRAVENOUS
  Filled 2019-12-09: qty 1

## 2019-12-09 MED ORDER — LACTATED RINGERS IV BOLUS (SEPSIS)
1000.0000 mL | Freq: Once | INTRAVENOUS | Status: AC
  Administered 2019-12-09: 1000 mL via INTRAVENOUS

## 2019-12-09 MED ORDER — SODIUM CHLORIDE 0.9 % IV SOLN
2.0000 g | INTRAVENOUS | Status: DC
  Administered 2019-12-09: 2 g via INTRAVENOUS
  Filled 2019-12-09: qty 20

## 2019-12-09 MED ORDER — ONDANSETRON HCL 4 MG/2ML IJ SOLN: 4.0000 mg | Freq: Four times a day (QID) | INTRAMUSCULAR | Status: DC | PRN

## 2019-12-09 MED ORDER — SODIUM CHLORIDE 0.9 % IV SOLN
100.0000 mg | Freq: Every day | INTRAVENOUS | Status: DC
  Administered 2019-12-10: 100 mg via INTRAVENOUS
  Filled 2019-12-09: qty 20

## 2019-12-09 MED ORDER — BARICITINIB 2 MG PO TABS
2.0000 mg | ORAL_TABLET | Freq: Every day | ORAL | Status: DC
  Administered 2019-12-09 – 2019-12-10 (×2): 2 mg via ORAL
  Filled 2019-12-09 (×2): qty 1

## 2019-12-09 MED ORDER — ASCORBIC ACID 500 MG PO TABS
500.0000 mg | ORAL_TABLET | Freq: Every day | ORAL | Status: DC
  Administered 2019-12-09 – 2019-12-10 (×2): 500 mg via ORAL
  Filled 2019-12-09 (×2): qty 1

## 2019-12-09 MED ORDER — LACTATED RINGERS IV BOLUS (SEPSIS)
500.0000 mL | Freq: Once | INTRAVENOUS | Status: AC
  Administered 2019-12-09: 500 mL via INTRAVENOUS

## 2019-12-09 MED ORDER — ALBUTEROL SULFATE HFA 108 (90 BASE) MCG/ACT IN AERS
2.0000 | INHALATION_SPRAY | Freq: Four times a day (QID) | RESPIRATORY_TRACT | Status: DC
  Administered 2019-12-09: 2 via RESPIRATORY_TRACT
  Filled 2019-12-09 (×2): qty 6.7

## 2019-12-09 MED ORDER — PREDNISONE 20 MG PO TABS: 50.0000 mg | ORAL_TABLET | Freq: Every day | ORAL | Status: DC

## 2019-12-09 MED ORDER — METHYLPREDNISOLONE SODIUM SUCC 125 MG IJ SOLR
125.0000 mg | Freq: Once | INTRAMUSCULAR | Status: AC
  Administered 2019-12-09: 125 mg via INTRAVENOUS
  Filled 2019-12-09: qty 2

## 2019-12-09 NOTE — ED Notes (Addendum)
Pt has removed non-rebreather mask from her face several times. Pt educated on the need to keep the mask on. MD notified, will try high flow nasal cannula.

## 2019-12-09 NOTE — H&P (Addendum)
History and Physical    Janice Werner NFA:[id number] DOB: Feb 21, 1951 DOA: 2019/12/14  PCP: Roger Kill, PA-C  Patient coming from: home  I have personally briefly reviewed patient's old medical records in [facility]  Chief Complaint: AMS.  HPI: Janice Werner is a 69 y.o. female with medical history significant of hypertension, morbid obesity, arthritis presents to emergency department for evaluation of AMS.  Apparently patient's family called EMS as patient was found laying supine on the floor at home.  Upon arrival of EMS: Patient was noted to be hypoxic in 40s on room air, had cold extremities & patient was complaining of nausea.  Patient was given 500 cc of IV fluid, Zofran and Rocephin by EMS.  Patient tells me that she has cough, generalized weakness, decreased appetite and lethargy however she denies any other symptoms including headache, blurry vision, chest pain, wheezing, shortness of breath, leg swelling, nausea, vomiting, diarrhea, loss of sense of taste or smell, abdominal pain, urinary or bowel changes.  She lives alone at home.  No history of smoking, alcohol, licit drug use.  She is not vaccinated against COVID-19.  During my encounter: Patients wants hydroxychloroquine and ivermectin for treatment for her COVID-19 pneumonia.-I discussed with the patient that these medications are not  FDA approved.  ED Course: Upon arrival to ED: Patient had fever of 101.1, tachycardic, tachypneic, hypoxic requiring nonrebreather mask, CMP shows AKI, troponin: 280, occult blood positive, CBC, PT/INR, APTT, lipase: WNL, lactic acid: 2.3, UA, UC, BC: Pending.  Chest x-ray concerning for atypical pneumonia.  PCR COVID-19 positive.  Patient received IV fluid, Rocephin, azithromycin and Solu-Medrol in ED.  [facility] hospitalist consulted for admission for acute hypoxemic respiratory failure secondary to COVID-19 pneumonia.  Review of Systems: As per HPI otherwise negative.     Past Medical History:  Diagnosis Date  . Arthritis   . Headache    migraines  . Hypertension   . Leg edema   . Malaise and fatigue     Past Surgical History:  Procedure Laterality Date  . ABDOMINAL HYSTERECTOMY    . BACK SURGERY     lumbar disc surgery fracture  . bone spur Left    foot  . CHOLECYSTECTOMY    . DILATION AND CURETTAGE OF UTERUS    . JOINT REPLACEMENT Left    Knee replacement  . LEFT OOPHORECTOMY    . TOTAL HIP ARTHROPLASTY Left 12/06/2017  . TOTAL HIP ARTHROPLASTY Left 12/06/2016   Procedure: LEFT TOTAL HIP ARTHROPLASTY ANTERIOR APPROACH;  Surgeon: Sheral Apley, MD;  Location: [facility] OR;  Service: Orthopedics;  Laterality: Left;  . TUBAL LIGATION       reports that she has never smoked. She has never used smokeless tobacco. She reports that she does not drink alcohol and does not use drugs.  Allergies  Allergen Reactions  . Codeine Nausea Only and Other (See Comments)    GI Upset (intolerance) Severe stabbing stomach pain  . Triazolam Other (See Comments)    Mental Status Changes (intolerance) hallucinations  . Losartan     Refuses to take as it causes tiredness and fatigue.     Family History  Problem Relation Age of Onset  . Hypertension Mother   . Alzheimer's disease Mother   . Breast cancer Mother   . Hypertension Father   . Parkinson's disease Father   . Hypertension Brother   . Rheum arthritis Brother     Prior to Admission medications   Medication Sig  Start Date End Date Taking? Authorizing Provider  acetaminophen (TYLENOL) 500 MG tablet Take 2 tablets by mouth daily.    [provider]  amLODipine (NORVASC) 10 MG tablet Take 1 tablet (10 mg total) by mouth every evening. 09/25/19 03/23/20  Tessa Lerner, DO    Physical Exam: Vitals:   12/16/2019 1600 12/03/2019 1630 11/29/2019 1734 12/08/2019 1740  BP: (!) 146/85 122/85 121/89   Pulse: 100 (!) 103 (!) 102 95  Resp: (!) 28 (!) 23 15 (!) 31  Temp:      TempSrc:      SpO2: 96%  (!) 89% (!) 87% 96%    Constitutional: NAD, calm, comfortable, on nonrebreather mask, morbidly obese, communicating well Eyes: PERRL, lids and conjunctivae normal ENMT: Mucous membranes are moist. Posterior pharynx clear of any exudate or lesions.Normal dentition.  Neck: normal, supple, no masses, no thyromegaly Respiratory: Tachypneic clear to auscultation bilaterally, no wheezing, no crackles. Normal respiratory effort. No accessory muscle use.  Cardiovascular: Tachycardic, no murmurs / rubs / gallops. No extremity edema. 2+ pedal pulses. No carotid bruits.  Abdomen: no tenderness, no masses palpated. No hepatosplenomegaly. Bowel sounds positive.  Musculoskeletal: no clubbing / cyanosis. No joint deformity upper and lower extremities. Good ROM, no contractures. Normal muscle tone.  Skin: no rashes, lesions, ulcers. No induration Neurologic: CN 2-12 grossly intact. Sensation intact, DTR normal. Strength 5/5 in all 4.  Psychiatric: Normal judgment and insight. Alert and oriented x 3. Normal mood.    Labs on Admission: I have personally reviewed following labs and imaging studies  CBC: Recent Labs  Lab 12/13/2019 1530 12/08/2019 1602  WBC 7.9  --   NEUTROABS 6.6  --   HGB 15.0 15.0  15.3*  HCT 47.9* 44.0  45.0  MCV 90.9  --   PLT 282  --    Basic Metabolic Panel: Recent Labs  Lab 11/25/2019 1530 12/17/2019 1602  NA 144 144  144  K 3.6 3.6  3.5  CL 105 107  CO2 25  --   GLUCOSE 116* 114*  BUN 33* 37*  CREATININE 1.31* 1.30*  CALCIUM 8.3*  --   MG 3.0*  --    GFR: CrCl cannot be calculated (Unknown ideal weight.). Liver Function Tests: Recent Labs  Lab 11/21/2019 1530  AST 48*  ALT 21  ALKPHOS 71  BILITOT 0.9  PROT 7.1  ALBUMIN 3.0*   Recent Labs  Lab 12/04/2019 1530  LIPASE 26   No results for input(s): AMMONIA in the last 168 hours. Coagulation Profile: Recent Labs  Lab 12/10/2019 1530  INR 1.2   Cardiac Enzymes: Recent Labs  Lab 12/18/2019 1530  CKTOTAL  239*   BNP (last 3 results) No results for input(s): PROBNP in the last 8760 hours. HbA1C: No results for input(s): HGBA1C in the last 72 hours. CBG: Recent Labs  Lab 11/25/2019 1507  GLUCAP 110*   Lipid Profile: No results for input(s): CHOL, HDL, LDLCALC, TRIG, CHOLHDL, LDLDIRECT in the last 72 hours. Thyroid Function Tests: No results for input(s): TSH, T4TOTAL, FREET4, T3FREE, THYROIDAB in the last 72 hours. Anemia Panel: No results for input(s): VITAMINB12, FOLATE, FERRITIN, TIBC, IRON, RETICCTPCT in the last 72 hours. Urine analysis: No results found for: COLORURINE, APPEARANCEUR, LABSPEC, PHURINE, GLUCOSEU, HGBUR, BILIRUBINUR, KETONESUR, PROTEINUR, UROBILINOGEN, NITRITE, LEUKOCYTESUR  Radiological Exams on Admission: DG Chest Port 1 View  Result Date: 12/07/2019 CLINICAL DATA:  Questionable sepsis EXAM: PORTABLE CHEST 1 VIEW COMPARISON:  None. FINDINGS: There is hazy bilateral airspace opacification. There  is no pneumothorax or large pleural effusion. The heart size is unremarkable. Aortic calcifications are noted. IMPRESSION: Hazy appearance of both lung fields may be secondary to an atypical infectious process such as viral pneumonia or developing interstitial edema. Electronically Signed   By: Katherine Mantle M.D.   On: 01/03/2020 15:30    EKG: Independently reviewed.  Sinus tachycardia, right axis deviation, prolonged QT interval.  Assessment/Plan Principal Problem:   Acute hypoxemic respiratory failure due to COVID-19 [facility]) Active Problems:   Essential hypertension   Hypercholesterolemia   Sepsis (HCC)   Elevated troponin   AKI (acute kidney injury) (HCC)    Sepsis/acute hypoxemic respiratory failure secondary to COVID-19 pneumonia: -Patient is not vaccinated against COVID-19.  Presented with cough, fever of 101.1, tachycardia, tachypnea, hypoxia requiring 15 L on nonrebreather mask.  Lactic acid: 2.3, COVID-19 positive, reviewed chest x-ray.  Received IV fluid,  Rocephin, azithromycin and Solu-Medrol in ED. -Admit patient stepdown unit for close monitoring.  On continuous pulse ox.  We will try to wean off oxygen as tolerated. -Start on remdesivir as per pharmacy and Solu-Medrol -Patient is requiring 15 L on NRB-we will start patient on Bracitinib 4 mg daily for 14 days. -Ordered inflammatory markers-all pending.  Repeat inflammatory markers tomorrow a.m.  Blood culture and procalcitonin: Pending. -Start on p.o. vitamins, albuterol every 6 hours.   -Monitor vitals closely.  Hold IV fluids for now.  Hypertension: Blood pressure is stable -We will hold amlodipine for now.  Elevated troponin: 280 -likely secondary to demand ischemia due to sepsis and COVID-19 infection.  Patient denies ACS symptoms.  Reviewed EKG.  Trend troponin.  AKI: -Likely secondary to dehydration.  Received IV fluid bolus in ED.  Avoid nephrotoxic medication.  Repeat BMP tomorrow a.m.  Morbid obesity: -Diet modification/weight loss recommended.  DVT prophylaxis: Lovenox/SCD Code Status: Full code Family Communication: None present at bedside.  Plan of care discussed with patient in length and she verbalized understanding and agreed with it.  I tried to call patient's daughter to discuss plan of care with no response.  Disposition Plan: Home in 3 to 4 days Consults called: None Admission status: Inpatient   Ollen Bowl MD [facility]  If 7PM-7AM, please contact night-coverage [email or web address] Password [facility]  2020-01-03, 5:56 PM

## 2019-12-09 NOTE — ED Notes (Signed)
Verbal order for soft wrist restraints received from dr Loney Loh.

## 2019-12-09 NOTE — ED Provider Notes (Signed)
[facility] EMERGENCY DEPARTMENT Provider Note   CSN: [id number] Arrival date & time: 12/07/2019  1448     History Chief Complaint  Patient presents with  . Altered Mental Status    Janice Werner is a 69 y.o. female.  Level 5 caveat secondary to altered mental status.  69 year old female lives alone was seen by family over a week ago.  Family went and checked on her and found her supine on the floor dried urine cold extremities.  EMS found her with sats in 40%.  Given Rocephin Zofran and fluids.  Blood sugar was normal.  There was possibly a fall.  Patient oriented to self.  Says she fell 10 days ago and has been on the floor since then.  Complaining of feeling thirsty.  Has had a cough.  Denies any chest pain abdominal pain nausea vomiting diarrhea.  The history is provided by the patient, the EMS personnel and a relative.  Weakness Severity:  Severe Onset quality:  Unable to specify Timing:  Constant Progression:  Unchanged Chronicity:  New Relieved by:  Nothing Worsened by:  Nothing Ineffective treatments:  None tried Associated symptoms: cough, falls and nausea   Associated symptoms: no abdominal pain, no chest pain, no diarrhea, no dysuria, no fever, no headaches and no vomiting        Past Medical History:  Diagnosis Date  . Arthritis   . Headache    migraines  . Hypertension   . Leg edema   . Malaise and fatigue     Patient Active Problem List   Diagnosis Date Noted  . Poorly-controlled hypertension 07/11/2019  . Morbid obesity with body mass index (BMI) of 40.0 or higher (HCC) 07/11/2019  . Snoring 07/11/2019  . Hypercholesterolemia 07/11/2019  . Viral upper respiratory tract infection 07/11/2019  . Essential hypertension 12/07/2016  . Primary osteoarthritis of left hip 11/07/2016    Past Surgical History:  Procedure Laterality Date  . ABDOMINAL HYSTERECTOMY    . BACK SURGERY     lumbar disc surgery fracture  . bone spur Left     foot  . CHOLECYSTECTOMY    . DILATION AND CURETTAGE OF UTERUS    . JOINT REPLACEMENT Left    Knee replacement  . LEFT OOPHORECTOMY    . TOTAL HIP ARTHROPLASTY Left 12/06/2017  . TOTAL HIP ARTHROPLASTY Left 12/06/2016   Procedure: LEFT TOTAL HIP ARTHROPLASTY ANTERIOR APPROACH;  Surgeon: Sheral Apley, MD;  Location: [facility] OR;  Service: Orthopedics;  Laterality: Left;  . TUBAL LIGATION       OB History   No obstetric history on file.     Family History  Problem Relation Age of Onset  . Hypertension Mother   . Alzheimer's disease Mother   . Breast cancer Mother   . Hypertension Father   . Parkinson's disease Father   . Hypertension Brother   . Rheum arthritis Brother     Social History   Tobacco Use  . Smoking status: Never Smoker  . Smokeless tobacco: Never Used  Vaping Use  . Vaping Use: Never used  Substance Use Topics  . Alcohol use: No  . Drug use: No    Home Medications Prior to Admission medications   Medication Sig Start Date End Date Taking? Authorizing Provider  acetaminophen (TYLENOL) 500 MG tablet Take 2 tablets by mouth daily.    [provider]  amLODipine (NORVASC) 10 MG tablet Take 1 tablet (10 mg total) by mouth every evening.  09/25/19 03/23/20  Tolia, Sunit, DO    Allergies    Codeine, Triazolam, and Losartan  Review of Systems   Review of Systems  Constitutional: Negative for fever.  HENT: Negative for sore throat.   Eyes: Negative for visual disturbance.  Respiratory: Positive for cough.   Cardiovascular: Negative for chest pain.  Gastrointestinal: Positive for nausea. Negative for abdominal pain, diarrhea and vomiting.  Genitourinary: Negative for dysuria.  Musculoskeletal: Positive for falls. Negative for neck pain.  Skin: Negative for rash.  Neurological: Positive for weakness. Negative for headaches.    Physical Exam Updated Vital Signs BP (!) 149/118   Pulse 92   Temp 98.8 F (37.1 C) (Oral)   Resp (!) 27   Ht 5\' 6"   (1.676 m)   Wt 108 kg   SpO2 (!) 88%   BMI 38.43 kg/m   Physical Exam Vitals and nursing note reviewed.  Constitutional:      General: She is not in acute distress.    Appearance: Normal appearance. She is well-developed. She is obese.  HENT:     Head: Normocephalic and atraumatic.  Eyes:     Conjunctiva/sclera: Conjunctivae normal.  Cardiovascular:     Rate and Rhythm: Regular rhythm. Tachycardia present.     Pulses: Normal pulses.     Heart sounds: No murmur heard.   Pulmonary:     Effort: Pulmonary effort is normal. No respiratory distress.     Breath sounds: Normal breath sounds.  Abdominal:     Palpations: Abdomen is soft.     Tenderness: There is no abdominal tenderness. There is no guarding or rebound.  Musculoskeletal:        General: No deformity. Normal range of motion.     Cervical back: Neck supple.  Skin:    General: Skin is warm and dry.  Neurological:     General: No focal deficit present.     Mental Status: She is alert. She is disoriented.     Sensory: No sensory deficit.     Motor: No weakness.     ED Results / Procedures / Treatments   Labs (all labs ordered are listed, but only abnormal results are displayed) Labs Reviewed  SARS CORONAVIRUS 2 BY RT PCR (HOSPITAL ORDER, PERFORMED IN [facility]) - Abnormal; Notable for the following components:      Result Value   SARS Coronavirus 2 POSITIVE (*)    All other components within normal limits  LACTIC ACID, PLASMA - Abnormal; Notable for the following components:   Lactic Acid, Venous 2.3 (*)    All other components within normal limits  LACTIC ACID, PLASMA - Abnormal; Notable for the following components:   Lactic Acid, Venous 2.2 (*)    All other components within normal limits  COMPREHENSIVE METABOLIC PANEL - Abnormal; Notable for the following components:   Glucose, Bld 116 (*)    BUN 33 (*)    Creatinine, Ser 1.31 (*)    Calcium 8.3 (*)    Albumin 3.0 (*)    AST 48 (*)     GFR calc non Af Amer 41 (*)    GFR calc Af Amer 48 (*)    All other components within normal limits  CBC WITH DIFFERENTIAL/PLATELET - Abnormal; Notable for the following components:   RBC 5.27 (*)    HCT 47.9 (*)    nRBC 0.3 (*)    Abs Immature Granulocytes 0.10 (*)    All other components within normal limits  URINALYSIS, ROUTINE W REFLEX MICROSCOPIC - Abnormal; Notable for the following components:   Color, Urine AMBER (*)    APPearance CLOUDY (*)    Hgb urine dipstick LARGE (*)    Protein, ur 30 (*)    Leukocytes,Ua MODERATE (*)    Bacteria, UA FEW (*)    All other components within normal limits  CK - Abnormal; Notable for the following components:   Total CK 239 (*)    All other components within normal limits  MAGNESIUM - Abnormal; Notable for the following components:   Magnesium 3.0 (*)    All other components within normal limits  CBC WITH DIFFERENTIAL/PLATELET - Abnormal; Notable for the following components:   Neutro Abs 7.8 (*)    All other components within normal limits  COMPREHENSIVE METABOLIC PANEL - Abnormal; Notable for the following components:   Glucose, Bld 137 (*)    BUN 28 (*)    Creatinine, Ser 1.09 (*)    Calcium 8.0 (*)    Total Protein 6.3 (*)    Albumin 2.5 (*)    AST 50 (*)    GFR calc non Af Amer 52 (*)    GFR calc Af Amer 60 (*)    All other components within normal limits  C-REACTIVE PROTEIN - Abnormal; Notable for the following components:   CRP 14.0 (*)    All other components within normal limits  D-DIMER, QUANTITATIVE (NOT AT [facility]) - Abnormal; Notable for the following components:   D-Dimer, Quant >20.00 (*)    All other components within normal limits  FERRITIN - Abnormal; Notable for the following components:   Ferritin 541 (*)    All other components within normal limits  MAGNESIUM - Abnormal; Notable for the following components:   Magnesium 2.6 (*)    All other components within normal limits  CREATININE, SERUM - Abnormal;  Notable for the following components:   Creatinine, Ser 1.23 (*)    GFR calc non Af Amer 45 (*)    GFR calc Af Amer 52 (*)    All other components within normal limits  LACTATE DEHYDROGENASE - Abnormal; Notable for the following components:   LDH 735 (*)    All other components within normal limits  I-STAT VENOUS BLOOD GAS, ED - Abnormal; Notable for the following components:   pH, Ven 7.442 (*)    pCO2, Ven 40.1 (*)    pO2, Ven 31.0 (*)    Acid-Base Excess 3.0 (*)    Calcium, Ion 0.98 (*)    Hemoglobin 15.3 (*)    All other components within normal limits  I-STAT CHEM 8, ED - Abnormal; Notable for the following components:   BUN 37 (*)    Creatinine, Ser 1.30 (*)    Glucose, Bld 114 (*)    Calcium, Ion 1.01 (*)    All other components within normal limits  POC OCCULT BLOOD, ED - Abnormal; Notable for the following components:   Fecal Occult Bld POSITIVE (*)    All other components within normal limits  CBG MONITORING, ED - Abnormal; Notable for the following components:   Glucose-Capillary 110 (*)    All other components within normal limits  I-STAT ARTERIAL BLOOD GAS, ED - Abnormal; Notable for the following components:   pO2, Arterial 53 (*)    Potassium 3.1 (*)    Calcium, Ion 1.09 (*)    All other components within normal limits  CBG MONITORING, ED - Abnormal; Notable for the following components:  Glucose-Capillary 139 (*)    All other components within normal limits  TROPONIN I (HIGH SENSITIVITY) - Abnormal; Notable for the following components:   Troponin I (High Sensitivity) 280 (*)    All other components within normal limits  TROPONIN I (HIGH SENSITIVITY) - Abnormal; Notable for the following components:   Troponin I (High Sensitivity) 185 (*)    All other components within normal limits  CULTURE, BLOOD (SINGLE)  URINE CULTURE  CULTURE, BLOOD (SINGLE)  PROTIME-INR  APTT  LIPASE, BLOOD  PHOSPHORUS  PROCALCITONIN  HIV ANTIBODY (ROUTINE TESTING W REFLEX)    BRAIN NATRIURETIC PEPTIDE  PATHOLOGIST SMEAR REVIEW  HEPATITIS B SURFACE ANTIGEN  BLOOD GAS, ARTERIAL  HEMOGLOBIN A1C  POC SARS CORONAVIRUS 2 AG -  ED  TYPE AND SCREEN  ABO/RH    EKG EKG Interpretation  Date/Time:  Monday December 09 2019 15:21:14 EDT Ventricular Rate:  107 PR Interval:    QRS Duration: 106 QT Interval:  382 QTC Calculation: 510 R Axis:   93 Text Interpretation: Sinus tachycardia Right axis deviation Low voltage, extremity leads Anteroseptal infarct, old Repol abnrm, severe global ischemia (LM/MVD) Prolonged QT interval No old tracing to compare Confirmed by Meridee Score [phone]) on 12/08/2019 3:22:27 PM   Radiology DG Chest Port 1 View  Result Date: 11/29/2019 CLINICAL DATA:  Questionable sepsis EXAM: PORTABLE CHEST 1 VIEW COMPARISON:  None. FINDINGS: There is hazy bilateral airspace opacification. There is no pneumothorax or large pleural effusion. The heart size is unremarkable. Aortic calcifications are noted. IMPRESSION: Hazy appearance of both lung fields may be secondary to an atypical infectious process such as viral pneumonia or developing interstitial edema. Electronically Signed   By: Katherine Mantle M.D.   On: 12/08/2019 15:30    Procedures .Critical Care Performed by: Terrilee Files, MD Authorized by: Terrilee Files, MD   Critical care provider statement:    Critical care time (minutes):  45   Critical care time was exclusive of:  Separately billable procedures and treating other patients   Critical care was necessary to treat or prevent imminent or life-threatening deterioration of the following conditions:  CNS failure or compromise and respiratory failure   Critical care was time spent personally by me on the following activities:  Discussions with consultants, evaluation of patient's response to treatment, examination of patient, ordering and performing treatments and interventions, ordering and review of laboratory studies,  ordering and review of radiographic studies, pulse oximetry, re-evaluation of patient's condition, obtaining history from patient or surrogate, review of old charts and development of treatment plan with patient or surrogate   I assumed direction of critical care for this patient from another provider in my specialty: no     (including critical care time)  Medications Ordered in ED Medications  remdesivir 200 mg in sodium chloride 0.9% 250 mL IVPB (0 mg Intravenous Stopped 12/08/2019 2333)    Followed by  remdesivir 100 mg in sodium chloride 0.9 % 100 mL IVPB (has no administration in time range)  baricitinib (OLUMIANT) tablet 2 mg (2 mg Oral Given 11/21/2019 2214)  albuterol (VENTOLIN HFA) 108 (90 Base) MCG/ACT inhaler 2 puff (2 puffs Inhalation Refused 12/10/19 0842)  ascorbic acid (VITAMIN C) tablet 500 mg (500 mg Oral Given 12/08/2019 2214)  zinc sulfate capsule 220 mg (220 mg Oral Given 11/28/2019 2214)  acetaminophen (TYLENOL) tablet 650 mg (has no administration in time range)  ondansetron (ZOFRAN) injection 4 mg (has no administration in time range)  LORazepam (ATIVAN) injection 1  mg (has no administration in time range)  methylPREDNISolone sodium succinate (SOLU-MEDROL) 125 mg/2 mL injection 60 mg (60 mg Intravenous Given 12/10/19 0841)  insulin aspart (novoLOG) injection 0-9 Units (1 Units Subcutaneous Given 12/10/19 0838)  insulin aspart (novoLOG) injection 0-5 Units (has no administration in time range)  lactated ringers infusion ( Intravenous New Bag/Given 12/10/19 0841)  enoxaparin (LOVENOX) injection 108 mg (108 mg Subcutaneous Refused 12/10/19 0839)  pantoprazole (PROTONIX) EC tablet 40 mg (has no administration in time range)  carvedilol (COREG) tablet 6.25 mg (has no administration in time range)  isosorbide mononitrate (IMDUR) 24 hr tablet 30 mg (has no administration in time range)  hydrALAZINE (APRESOLINE) injection 10 mg (has no administration in time range)  potassium chloride  (KLOR-CON) packet 40 mEq (has no administration in time range)  aspirin chewable tablet 81 mg (has no administration in time range)  lactated ringers bolus 500 mL (0 mLs Intravenous Stopped 12/19/2019 1630)  acetaminophen (TYLENOL) suppository 650 mg (650 mg Rectal Given 12/16/2019 1702)  lactated ringers bolus 1,000 mL (0 mLs Intravenous Stopped 12/10/2019 1800)  methylPREDNISolone sodium succinate (SOLU-MEDROL) 125 mg/2 mL injection 125 mg (125 mg Intravenous Given 12/08/2019 2212)  LORazepam (ATIVAN) injection 1 mg (1 mg Intravenous Given by Other 11/30/2019 2328)    ED Course  I have reviewed the triage vital signs and the nursing notes.  Pertinent labs & imaging results that were available during my care of the patient were reviewed by me and considered in my medical decision making (see chart for details).  Clinical Course as of Dec 10 1018  Mon Dec 09, 2019  1518 Was informed by the tech that they saw some dark stools on the patient's to stool guaiac and it was positive.  Have added on a type and screen   [MB]  1537 Chest x-ray ordered and interpreted by me, bilateral interstitial haziness possible infiltrate.   [MB]  1744 Patient's Covid testing came back positive.  Troponin also elevated at 280.  Lactate elevated at 2.3.  Will hold off extra fluids as probably not sepsis and probably more likely Covid.   [MB]  1752 Discussed with [facility] hospitalist Dr. Jacqulyn Bath who will evaluate the patient for admission.   [MB]  1806 I was able to reach the patient's daughter and updated her on the patient's Covid diagnosis and the need for admission.   [MB]    Clinical Course User Index [MB] Terrilee Files, MD   MDM Rules/Calculators/A&P                         This patient complains of fall weakness hypoxia altered mental status; this involves an extensive number of treatment Options and is a complaint that carries with it a high risk of complications and Morbidity. The differential includes Covid,  rhabdomyolysis, dehydration, metabolic derangement, pneumonia, PE  I ordered, reviewed and interpreted labs, which included CBC with normal white count normal hemoglobin, chemistries with mild elevations BUN and creatinine, elevation of AST, inflammatory markers elevated, magnesium elevated, CPK elevated but surprisingly not very much , troponin elevated and will need to be trended, Covid testing positive I ordered medication IV fluids IV antibiotics for possible pneumonia, steroids for her positive Covid I ordered imaging studies which included chest x-ray and I independently    visualized and interpreted imaging which showed hazy changes bilaterally consistent with viral pneumonia Additional history obtained from EMS and patient's daughter Previous records obtained and reviewed in epic,  no recent admissions I consulted [facility] hospitalist Dr. Jacqulyn BathPahwani and discussed lab and imaging findings  Critical Interventions: Work-up and management of patient's hypoxia and altered mental status  After the interventions stated above, I reevaluated the patient and found patient still to be quiring significant amount of oxygen.  She is Covid positive.  She did receive antibiotics but this is likely all Covid.  She will need to be admitted to the hospital for oxygen IV steroids and other supportive treatment.  I updated the patient's daughter of same and she is in agreement.  Landry Dykeatricia A Harmon was evaluated in Emergency Department on 11/24/2019 for the symptoms described in the history of present illness. She was evaluated in the context of the global COVID-19 pandemic, which necessitated consideration that the patient might be at risk for infection with the SARS-CoV-2 virus that causes COVID-19. Institutional protocols and algorithms that pertain to the evaluation of patients at risk for COVID-19 are in a state of rapid change based on information released by regulatory bodies including the CDC and federal and state  organizations. These policies and algorithms were followed during the patient's care in the ED.   Final Clinical Impression(s) / ED Diagnoses Final diagnoses:  Acute hypoxemic respiratory failure due to COVID-19 (HCC)  Elevated troponin  Guaiac positive stools    Rx / DC Orders ED Discharge Orders    None       Terrilee FilesButler, Daisi Kentner C, MD 12/10/19 1025

## 2019-12-09 NOTE — ED Triage Notes (Signed)
Pt arrived via EMS from home found laying supine on the floor in home by family. Family reports AMS on their arrival. EMS report oxygen @40 % on their arrival with cold extremities noted. Pt given rocephin 1mg  IM, 4mg  zofran and NS by EMS.Pt c/o nausea at this time. BGL was 154 per EMS. Pt thinks she fell but is unsure of which day this occurred. Family has not seen the patient for over a week before finding her down today.

## 2019-12-09 NOTE — ED Notes (Signed)
Pt keeps taking her oxygen off and states "just leave it off." MD notified.

## 2019-12-09 NOTE — Sepsis Progress Note (Signed)
Notified bedside nurse of need to draw repeat lactic acid. 

## 2019-12-09 NOTE — ED Notes (Signed)
Pt refusing blood work and stating she wants to go home, will notify md.

## 2019-12-10 ENCOUNTER — Encounter (HOSPITAL_COMMUNITY): Payer: Self-pay | Admitting: Internal Medicine

## 2019-12-10 ENCOUNTER — Telehealth: Payer: Self-pay | Admitting: Neurology

## 2019-12-10 ENCOUNTER — Inpatient Hospital Stay (HOSPITAL_COMMUNITY): Payer: Medicare HMO

## 2019-12-10 ENCOUNTER — Other Ambulatory Visit: Payer: Self-pay

## 2019-12-10 ENCOUNTER — Ambulatory Visit: Payer: Self-pay | Admitting: Neurology

## 2019-12-10 ENCOUNTER — Encounter: Payer: Self-pay | Admitting: Neurology

## 2019-12-10 DIAGNOSIS — R7989 Other specified abnormal findings of blood chemistry: Secondary | ICD-10-CM

## 2019-12-10 LAB — URINALYSIS, ROUTINE W REFLEX MICROSCOPIC
Bilirubin Urine: NEGATIVE
Glucose, UA: NEGATIVE mg/dL
Ketones, ur: NEGATIVE mg/dL
Nitrite: NEGATIVE
Protein, ur: 30 mg/dL — AB
Specific Gravity, Urine: 1.028 (ref 1.005–1.030)
pH: 5 (ref 5.0–8.0)

## 2019-12-10 LAB — CBC WITH DIFFERENTIAL/PLATELET
Abs Immature Granulocytes: 0.07 10*3/uL (ref 0.00–0.07)
Basophils Absolute: 0 10*3/uL (ref 0.0–0.1)
Basophils Relative: 0 %
Eosinophils Absolute: 0 10*3/uL (ref 0.0–0.5)
Eosinophils Relative: 0 %
HCT: 43.6 % (ref 36.0–46.0)
Hemoglobin: 13.7 g/dL (ref 12.0–15.0)
Immature Granulocytes: 1 %
Lymphocytes Relative: 7 %
Lymphs Abs: 0.7 10*3/uL (ref 0.7–4.0)
MCH: 28.8 pg (ref 26.0–34.0)
MCHC: 31.4 g/dL (ref 30.0–36.0)
MCV: 91.8 fL (ref 80.0–100.0)
Monocytes Absolute: 0.3 10*3/uL (ref 0.1–1.0)
Monocytes Relative: 3 %
Neutro Abs: 7.8 10*3/uL — ABNORMAL HIGH (ref 1.7–7.7)
Neutrophils Relative %: 89 %
Platelets: 195 10*3/uL (ref 150–400)
RBC: 4.75 MIL/uL (ref 3.87–5.11)
RDW: 14.7 % (ref 11.5–15.5)
WBC: 8.8 10*3/uL (ref 4.0–10.5)
nRBC: 0.2 % (ref 0.0–0.2)

## 2019-12-10 LAB — HIV ANTIBODY (ROUTINE TESTING W REFLEX): HIV Screen 4th Generation wRfx: NONREACTIVE

## 2019-12-10 LAB — COMPREHENSIVE METABOLIC PANEL
ALT: 20 U/L (ref 0–44)
AST: 50 U/L — ABNORMAL HIGH (ref 15–41)
Albumin: 2.5 g/dL — ABNORMAL LOW (ref 3.5–5.0)
Alkaline Phosphatase: 70 U/L (ref 38–126)
Anion gap: 12 (ref 5–15)
BUN: 28 mg/dL — ABNORMAL HIGH (ref 8–23)
CO2: 25 mmol/L (ref 22–32)
Calcium: 8 mg/dL — ABNORMAL LOW (ref 8.9–10.3)
Chloride: 108 mmol/L (ref 98–111)
Creatinine, Ser: 1.09 mg/dL — ABNORMAL HIGH (ref 0.44–1.00)
GFR calc Af Amer: 60 mL/min — ABNORMAL LOW (ref >60)
GFR calc non Af Amer: 52 mL/min — ABNORMAL LOW (ref >60)
Glucose, Bld: 137 mg/dL — ABNORMAL HIGH (ref 70–99)
Potassium: 3.5 mmol/L (ref 3.5–5.1)
Sodium: 145 mmol/L (ref 135–145)
Total Bilirubin: 0.4 mg/dL (ref 0.3–1.2)
Total Protein: 6.3 g/dL — ABNORMAL LOW (ref 6.5–8.1)

## 2019-12-10 LAB — I-STAT ARTERIAL BLOOD GAS, ED
Acid-Base Excess: 0 mmol/L (ref 0.0–2.0)
Bicarbonate: 24.3 mmol/L (ref 20.0–28.0)
Calcium, Ion: 1.09 mmol/L — ABNORMAL LOW (ref 1.15–1.40)
HCT: 43 % (ref 36.0–46.0)
Hemoglobin: 14.6 g/dL (ref 12.0–15.0)
O2 Saturation: 84 %
Patient temperature: 101.1
Potassium: 3.1 mmol/L — ABNORMAL LOW (ref 3.5–5.1)
Sodium: 143 mmol/L (ref 135–145)
TCO2: 25 mmol/L (ref 22–32)
pCO2 arterial: 41 mmHg (ref 32.0–48.0)
pH, Arterial: 7.387 (ref 7.350–7.450)
pO2, Arterial: 53 mmHg — ABNORMAL LOW (ref 83.0–108.0)

## 2019-12-10 LAB — CBG MONITORING, ED
Glucose-Capillary: 139 mg/dL — ABNORMAL HIGH (ref 70–99)
Glucose-Capillary: 130 mg/dL — ABNORMAL HIGH (ref 70–99)

## 2019-12-10 LAB — D-DIMER, QUANTITATIVE: D-Dimer, Quant: >20 ug/mL-FEU — ABNORMAL HIGH (ref 0.00–0.50)

## 2019-12-10 LAB — FERRITIN: Ferritin: 541 ng/mL — ABNORMAL HIGH (ref 11–307)

## 2019-12-10 LAB — PHOSPHORUS: Phosphorus: 3.2 mg/dL (ref 2.5–4.6)

## 2019-12-10 LAB — HEPATITIS B SURFACE ANTIGEN: Hepatitis B Surface Ag: NONREACTIVE

## 2019-12-10 LAB — URINE CULTURE

## 2019-12-10 LAB — MAGNESIUM: Magnesium: 2.6 mg/dL — ABNORMAL HIGH (ref 1.7–2.4)

## 2019-12-10 LAB — C-REACTIVE PROTEIN: CRP: 14 mg/dL — ABNORMAL HIGH (ref <1.0)

## 2019-12-10 LAB — HEMOGLOBIN A1C
Hgb A1c MFr Bld: 6 % — ABNORMAL HIGH (ref 4.8–5.6)
Mean Plasma Glucose: 125.5 mg/dL

## 2019-12-10 LAB — GLUCOSE, CAPILLARY
Glucose-Capillary: 140 mg/dL — ABNORMAL HIGH (ref 70–99)
Glucose-Capillary: 123 mg/dL — ABNORMAL HIGH (ref 70–99)

## 2019-12-10 LAB — ABO/RH: ABO/RH(D): A POS

## 2019-12-10 LAB — BRAIN NATRIURETIC PEPTIDE: B Natriuretic Peptide: 172.7 pg/mL — ABNORMAL HIGH (ref 0.0–100.0)

## 2019-12-10 MED ORDER — LACTATED RINGERS IV SOLN: INTRAVENOUS | Status: DC

## 2019-12-10 MED ORDER — ISOSORBIDE MONONITRATE ER 30 MG PO TB24
30.0000 mg | ORAL_TABLET | Freq: Every day | ORAL | Status: DC
  Administered 2019-12-10: 30 mg via ORAL
  Filled 2019-12-10: qty 1

## 2019-12-10 MED ORDER — MORPHINE SULFATE (PF) 2 MG/ML IV SOLN
1.0000 mg | INTRAVENOUS | Status: DC | PRN
  Administered 2019-12-10 – 2019-12-11 (×3): 1 mg via INTRAVENOUS
  Filled 2019-12-10 (×3): qty 1

## 2019-12-10 MED ORDER — METHYLPREDNISOLONE SODIUM SUCC 125 MG IJ SOLR
60.0000 mg | Freq: Two times a day (BID) | INTRAMUSCULAR | Status: DC
  Administered 2019-12-10: 60 mg via INTRAVENOUS
  Filled 2019-12-10 (×2): qty 2

## 2019-12-10 MED ORDER — LORAZEPAM 1 MG PO TABS: 1.0000 mg | ORAL_TABLET | ORAL | Status: DC | PRN

## 2019-12-10 MED ORDER — ALBUTEROL SULFATE HFA 108 (90 BASE) MCG/ACT IN AERS: 2.0000 | INHALATION_SPRAY | RESPIRATORY_TRACT | Status: DC | PRN

## 2019-12-10 MED ORDER — CARVEDILOL 6.25 MG PO TABS
6.2500 mg | ORAL_TABLET | Freq: Two times a day (BID) | ORAL | Status: DC
  Administered 2019-12-10 (×2): 6.25 mg via ORAL
  Filled 2019-12-10 (×2): qty 1

## 2019-12-10 MED ORDER — INSULIN ASPART 100 UNIT/ML ~~LOC~~ SOLN
0.0000 [IU] | Freq: Three times a day (TID) | SUBCUTANEOUS | Status: DC
  Administered 2019-12-10 (×2): 1 [IU] via SUBCUTANEOUS

## 2019-12-10 MED ORDER — LORAZEPAM 2 MG/ML IJ SOLN
1.0000 mg | Freq: Once | INTRAMUSCULAR | Status: DC | PRN
  Filled 2019-12-10: qty 1

## 2019-12-10 MED ORDER — HYDRALAZINE HCL 20 MG/ML IJ SOLN: 10.0000 mg | Freq: Four times a day (QID) | INTRAMUSCULAR | Status: DC | PRN

## 2019-12-10 MED ORDER — ASPIRIN 81 MG PO CHEW
81.0000 mg | CHEWABLE_TABLET | Freq: Every day | ORAL | Status: DC
  Administered 2019-12-10: 81 mg via ORAL
  Filled 2019-12-10: qty 1

## 2019-12-10 MED ORDER — LORAZEPAM 2 MG/ML PO CONC: 1.0000 mg | ORAL | Status: DC | PRN

## 2019-12-10 MED ORDER — INSULIN ASPART 100 UNIT/ML ~~LOC~~ SOLN: 0.0000 [IU] | Freq: Every day | SUBCUTANEOUS | Status: DC

## 2019-12-10 MED ORDER — ONDANSETRON 4 MG PO TBDP: 4.0000 mg | ORAL_TABLET | Freq: Four times a day (QID) | ORAL | Status: DC | PRN

## 2019-12-10 MED ORDER — LORAZEPAM 2 MG/ML IJ SOLN
1.0000 mg | INTRAMUSCULAR | Status: DC | PRN
  Administered 2019-12-10 (×2): 1 mg via INTRAVENOUS
  Filled 2019-12-10 (×2): qty 1

## 2019-12-10 MED ORDER — ONDANSETRON HCL 4 MG/2ML IJ SOLN: 4.0000 mg | Freq: Four times a day (QID) | INTRAMUSCULAR | Status: DC | PRN

## 2019-12-10 MED ORDER — POTASSIUM CHLORIDE 20 MEQ PO PACK
40.0000 meq | PACK | Freq: Once | ORAL | Status: DC
  Filled 2019-12-10: qty 2

## 2019-12-10 MED ORDER — PANTOPRAZOLE SODIUM 40 MG PO TBEC
40.0000 mg | DELAYED_RELEASE_TABLET | Freq: Every day | ORAL | Status: DC
  Administered 2019-12-10: 40 mg via ORAL
  Filled 2019-12-10: qty 1

## 2019-12-10 MED ORDER — ENOXAPARIN SODIUM 120 MG/0.8ML ~~LOC~~ SOLN
108.0000 mg | Freq: Two times a day (BID) | SUBCUTANEOUS | Status: DC
  Administered 2019-12-10: 108 mg via SUBCUTANEOUS
  Filled 2019-12-10 (×3): qty 0.72

## 2019-12-10 NOTE — Progress Notes (Signed)
Lower extremity venous bilateral study completed.  Preliminary results relayed to RN in ED and Thedore Mins, MD.   See CV Proc for preliminary results report.   Jean Rosenthal, RDMS

## 2019-12-10 NOTE — ED Notes (Signed)
Pt noted to have removed her oxygen by shaking her head from side to side. Pt refuses to let RN replace the nasal cannula in her nose. RN is attempting to place the non rebreather back on the patients face but she continues to shake her head and states she does not want it on. MD paged, assistance requested from other staff members to try and reorient pt.

## 2019-12-10 NOTE — ED Notes (Signed)
Pt continuously yelling out that she needs water. Pt given ice chips to wet mouth. Pt stated that she needs restraints off so that she can remove her oxygen. This RN explained to pt that she needs the oxygen at this time due to a drop in O2 as soon as NRB and [place] are removed. Pt states "I will remove the damn thing, just loosen my hands."

## 2019-12-10 NOTE — Progress Notes (Signed)
Pt arrived to the floor via stretcher by RN and RT. Pt alert and oriented x4. Respirations even and unlabored on HHFNC 60L/100% and 15 L NRB. NAD. Skin assessment completed with Denny Peon RN. CHG bath given by Kara Mead NT. Pt oriented to room and call bell. Pt strongly instructed not to pull off NRB mask. Pt encouraged to use call bell for assistance. All questions/concerns have been addressed with pt at this time.

## 2019-12-10 NOTE — Progress Notes (Signed)
Floor coverage  Patient admitted for acute hypoxic respiratory failure secondary to severe COVID-19 viral pneumonia.  Oxygen saturation continues to be low in the 60s to 70s on 60 L oxygen via H-HFNC, 100% FiO2 plus 15 L via nonrebreather.  Respiratory rate in the mid 20s at present.  She is being treated with a combination of high-dose IV steroids, remdesivir, and baricitinib.  -I had a goals of care discussion with the patient's daughter Neysa Bonito [phone].  She confirms that the patient is DNR/DNI.  I have explained to her daughter that at this point the only option left is trying BiPAP to see if it helps with her hypoxemia.  Daughter is in agreement and understands that the patient's prognosis is extremely guarded.  I discussed the case with Dr. Violet Baldy, PCCM team will see the patient. -Increase to 70 L oxygen via H-HFNC, 100% FiO2 plus 15 L via nonrebreather until the patient can be transferred to the ICU and started on BiPAP.

## 2019-12-10 NOTE — ED Notes (Signed)
Attempted x2 in pt bilateral hands to get blood work and was unsuccessful. Lab will attempt.

## 2019-12-10 NOTE — TOC Benefit Eligibility Note (Signed)
Transition of Care [facility]) Benefit Eligibility Note    Patient Details  Name: Janice Werner MRN: [id number] Date of Birth: Jul 14, 1950   Medication/Dose: Carlena Hurl 15 MG BID   XARELTO 20 MG DAILY   and   ELIQUIS  2.5 MG  BID   ELIQUIS  5 MG BID   ELIQUIS  10 MG BID NON-FORMULARY  Covered?: Yes  Tier: 3 Drug  Prescription Coverage [facility]erred [facility]: [facility] with Person/Company/Phone Number:: JAMIE  @ [employer] # [id number]  Co-Pay: $ 4.00 FOR EACH PRESCRIPTION  Prior Approval: No  Deductible:  (LOWE INCOME SUBSIDY)  Additional Notes: SECONDARY INS : MEDICAID OF [place]: EFF-DATE- 08-20-2014  CO-PAY- $4.00 FOR EACH PRESCRIPTION    Mardene Sayer Phone Number: 12/10/2019, 5:20 PM

## 2019-12-10 NOTE — Progress Notes (Signed)
RT assessed patient on HFNC. Patient on 50L and 100% with NRB. When patient is awake, sats are 95%. When patient is sleeping, sats as low as 84%. RT will continue to monitor.

## 2019-12-10 NOTE — ED Notes (Signed)
Stuck pt x3 no blood return  Hard stick

## 2019-12-10 NOTE — Significant Event (Signed)
Consulted for ongoing hypoxia. COIVD+. On H-HFNC + NRB., oxygen saturation 50-60s. Previous progress note states if declines then plan for comfort care. Spoke to [employer]. States this has all happened very rapidly however does not want mother to suffer and wishes for her to pass peacefully. Janice Werner is also on call to support wife and her decision. She is very tearful however understands that patient would not want to suffer. At this time agrees to [facility]. Have spoke with charge nurse regarding plan for bedside nurse to call daughter and let her speak to patient, daughter understands that patient is confused however would like to tell her how much she loves her. PRN Ativan and Morphine ordered.

## 2019-12-10 NOTE — ED Notes (Signed)
Family updated on patients status and is aware that patient is in restraints.

## 2019-12-10 NOTE — ED Notes (Signed)
Checked patient cbg it was 130 notified RN of blood sugar patient is resting with call bell in reach

## 2019-12-10 NOTE — Telephone Encounter (Signed)
Pt no show to apt today, in reviewing the chart appears that she is in ER

## 2019-12-10 NOTE — Progress Notes (Signed)
SLP Cancellation Note  Patient Details Name: Janice Werner MRN: [id number] DOB: 12-22-1950   Cancelled treatment:       Reason Eval/Treat Not Completed: Medical issues which prohibited therapy.   Orders received for BSE to determine appropriate PO diet. Pt is currently awake, requiring increased O2 support via HFNC with NRB, 50L/min, 100% FiO2. Sats are currently in the low 80's.   Will continue efforts to complete swallow evaluation when pt respiratory status is more stable. RN aware and in agreement.   Brooklyn Alfredo B. Murvin Natal, [facility], CCC-SLP Speech Language Pathologist Office: [phone] Pager: [phone]  Leigh Aurora 12/10/2019, 8:52 AM

## 2019-12-10 NOTE — Progress Notes (Signed)
ANTICOAGULATION CONSULT NOTE - Initial Consult  Pharmacy Consult for Lovenox Indication: Elevated D-dimer >20 in setting of COVID infection  Allergies  Allergen Reactions  . Codeine Nausea Only and Other (See Comments)    GI Upset (intolerance) Severe stabbing stomach pain  . Triazolam Other (See Comments)    Mental Status Changes (intolerance) hallucinations  . Losartan     Refuses to take as it causes tiredness and fatigue.     Patient Measurements: Height: 5\' 6"  (167.6 cm) Weight: 108 kg (238 lb 1.6 oz) IBW/kg (Calculated) : 59.3  Vital Signs: Temp: 98.8 F (37.1 C) (09/21 0100) Temp Source: Oral (09/21 0100) BP: 170/115 (09/21 0551) Pulse Rate: 90 (09/21 0551)  Labs: Recent Labs    12/24/2019 1530 Dec 24, 2019 1530 2019/12/24 1602 Dec 24, 2019 1602 2019/12/24 2233 24-Dec-2019 2358 12/10/19 0128  HGB 15.0   < > 15.0  15.3*   < >  --  14.6 13.7  HCT 47.9*   < > 44.0  45.0  --   --  43.0 43.6  PLT 282  --   --   --   --   --  195  APTT 26  --   --   --   --   --   --   LABPROT 14.6  --   --   --   --   --   --   INR 1.2  --   --   --   --   --   --   CREATININE 1.31*   < > 1.30*  --  1.23*  --  1.09*  CKTOTAL 239*  --   --   --   --   --   --   TROPONINIHS 280*  --   --   --  185*  --   --    < > = values in this interval not displayed.    Estimated Creatinine Clearance: 60.6 mL/min (A) (by C-G formula based on SCr of 1.09 mg/dL (H)).   Medical History: Past Medical History:  Diagnosis Date  . Arthritis   . Headache    migraines  . Hypertension   . Leg edema   . Malaise and fatigue    Assessment: 15 YOF presenting with AMS, positive for COVID-19 and now with elevated D-dimer >20.  Pharmacy consulted to initiate Lovenox for full anticoagulation until D-dimer <2.  Not on anticoagulation PTA.  CBC wnl.    Goal of Therapy:  Anti-Xa level 0.6-1 units/ml 4hrs after LMWH dose given Monitor platelets by anticoagulation protocol: Yes   Plan:  Lovenox 1mg /kg (108 mg)  SQ every 12 hours Monitor renal function, CBC, s/s bleeding F/u D-dimer levels, VTE workup/clinical progression and LOT  78, PharmD Clinical Pharmacist ED Pharmacist Phone # [phone] 12/10/2019 7:22 AM

## 2019-12-10 NOTE — ED Notes (Signed)
Pt removed both NRB mask and HFNC. SpO2 19%. Pt alert, educated about not removing oxygen. Mask and [place] replaced.

## 2019-12-10 NOTE — Progress Notes (Addendum)
PROGRESS NOTE                                                                                                                                                                                                             Patient Demographics:    Janice Werner, is a 69 y.o. female, DOB - Aug 23, 1950, OFB:[id number]  Outpatient Primary MD for the patient is Bernita Buffy    LOS - 1  Admit date - 12/03/2019    Chief Complaint  Patient presents with  . Altered Mental Status       Brief Narrative  Janice Werner is a 69 y.o. female with medical history significant of hypertension, morbid obesity, arthritis presents to emergency department for evaluation of AMS, according to the daughter patient has not been feeling well for the last several days and for the last 3 to 4 days she was unable to reach her mom's cell phone and it was going straight to answering machine, when she was checked upon she was found to be on the floor and was likely on the floor for several days, EMS found her to be severely hypoxic and she was brought to the ER where she was found to have acute hypoxic respiratory failure due to COVID-19 pneumonia along with toxic encephalopathy, dehydration and AKI and she was admitted to the hospital.    Subjective:    Leola Brazil today has, No headache, No chest pain, No abdominal pain - No Nausea, No new weakness tingling or numbness, no SOB.   Assessment  & Plan :     1. Acute Hypoxic Resp. Failure due to Acute Covid 19 Viral Pneumonitis during the ongoing 2020 Covid 19 Pandemic - she is unfortunately unvaccinated and has incurred severe lung parenchymal injury and was probably at home minimally responsive for several days before she has been brought to the hospital.  She is currently on combination of high-dose IV steroids, remdesivir and Baricitinib.  Overall extremely tenuous currently on  combination of 50 L heated high flow and nonrebreather mask with extremely guarded prognosis.  She is DNR.  Encouraged the patient to sit up in chair in the daytime use I-S and flutter valve for pulmonary toiletry and then prone in bed when at night.  Will advance activity and titrate down oxygen as possible.  SpO2: 91 %  O2 Flow Rate (L/min): 50 L/min (15L NRB) FiO2 (%): 100 %  Recent Labs  Lab 2019-12-25 1530 December 25, 2019 1544 12-25-19 1605 2019-12-25 2221 25-Dec-2019 2233 12/10/19 0128  WBC 7.9  --   --   --   --  8.8  PLT 282  --   --   --   --  195  CRP  --   --   --   --   --  14.0*  DDIMER  --   --   --   --   --  >20.00*  PROCALCITON  --   --   --   --  0.22  --   AST 48*  --   --   --   --  50*  ALT 21  --   --   --   --  20  ALKPHOS 71  --   --   --   --  70  BILITOT 0.9  --   --   --   --  0.4  ALBUMIN 3.0*  --   --   --   --  2.5*  INR 1.2  --   --   --   --   --   LATICACIDVEN  --  2.3*  --  2.2*  --   --   SARSCOV2NAA  --   --  POSITIVE*  --   --   --       2.  Extremely elevated D-dimer.  High likelihood of getting a blood clot, full dose Lovenox till INR drops below 2, check leg ultrasound.  Once she is stable will try CT angiogram if possible.  3.  Hypertension.  Placed on Coreg, Imdur and as needed hydralazine combination will monitor.  4.  Morbid obesity.  BMI of 38.  Follow with PCP for weight loss.  5.  AKI due to dehydration.  Improved with IV fluids, continue gentle hydration.  6.  Sepsis due to COVID-19 infection.  Do not think there is a bacterial component.  Procalcitonin is stable, stop antibiotics and monitor.  Follow cultures,  7.  Non-ACS pattern flat trend troponin rise likely due to stress of sepsis and hypoxia causing demand ischemia, EKG stable chest pain-free, placed on aspirin and beta-blocker as tolerated.  Will monitor.   Addendum. Leg ultrasound is back with bilateral DVTs in lower extremities, mobile thrombus noted in right common femoral. Case  discussed with Dr. Randie Heinz. Continue anticoagulation.   Nurse updated with the plan again at 2 PM on 12/10/2019.  If significant decline we will transition to comfort measures, for now continue supportive care.    Condition - Extremely Guarded ++  Family Communication  :  Called daughter Janice Werner [phone] on 12/10/2018 updated about guarded prognosis and DNR, agrees.  Code Status :  DNR per patient's wishes, nurse Maralyn Sago in the ER room 25 witness.  Consults  : None  Procedures  : None  PUD Prophylaxis : PPI  Disposition Plan  :    Status is: Inpatient  Remains inpatient appropriate because:IV treatments appropriate due to intensity of illness or inability to take PO   Dispo: The patient is from: Home              Anticipated d/c is to: SNF              Anticipated d/c date is: > 3 days              Patient currently  is not medically stable to d/c.  DVT Prophylaxis  :  Lovenox full dose ordered Via Pharm  Lab Results  Component Value Date   PLT 195 12/10/2019    Diet :  Diet Order            Diet NPO time specified  Diet effective now                  Inpatient Medications  Scheduled Meds: . albuterol  2 puff Inhalation Q6H  . vitamin C  500 mg Oral Daily  . baricitinib  2 mg Oral Daily  . insulin aspart  0-5 Units Subcutaneous QHS  . insulin aspart  0-9 Units Subcutaneous TID WC  . methylPREDNISolone (SOLU-MEDROL) injection  60 mg Intravenous Q12H  . zinc sulfate  220 mg Oral Daily   Continuous Infusions: . azithromycin Stopped (01-02-2020 1951)  . cefTRIAXone (ROCEPHIN)  IV Stopped (January 02, 2020 1853)  . lactated ringers    . remdesivir 100 mg in NS 100 mL     PRN Meds:.acetaminophen, LORazepam, ondansetron **OR** ondansetron (ZOFRAN) IV  Antibiotics  :    Anti-infectives (From admission, onward)   Start     Dose/Rate Route Frequency Ordered Stop   12/10/19 1000  remdesivir 100 mg in sodium chloride 0.9 % 100 mL IVPB       "Followed by" Linked Group  Details   100 mg 200 mL/hr over 30 Minutes Intravenous Daily 2020/01/02 1756 12/14/19 0959   01/02/20 1800  remdesivir 200 mg in sodium chloride 0.9% 250 mL IVPB       "Followed by" Linked Group Details   200 mg 580 mL/hr over 30 Minutes Intravenous Once 01-02-20 1756 January 02, 2020 2333   2020-01-02 1645  cefTRIAXone (ROCEPHIN) 2 g in sodium chloride 0.9 % 100 mL IVPB        2 g 200 mL/hr over 30 Minutes Intravenous Every 24 hours 01/02/20 1638     02-Jan-2020 1645  azithromycin (ZITHROMAX) 500 mg in sodium chloride 0.9 % 250 mL IVPB        500 mg 250 mL/hr over 60 Minutes Intravenous Every 24 hours 2020/01/02 1638         Time Spent in minutes  30   Susa Raring M.D on 12/10/2019 at 7:18 AM  To page go to [email or web address] - password [facility]  [facility] -  Office  [phone]    See all Orders from today for further details    Objective:   Vitals:   12/10/19 0318 12/10/19 0400 12/10/19 0500 12/10/19 0551  BP: 135/87 (!) 148/79 (!) 149/91 (!) 170/115  Pulse: 96 (!) 101 98 90  Resp: (!) 35 (!) 30 (!) 27 20  Temp:      TempSrc:      SpO2: 96% 92% 91% 91%  Weight:      Height:        Wt Readings from Last 3 Encounters:  01/02/20 108 kg  09/25/19 115.2 kg  07/11/19 111.6 kg    No intake or output data in the 24 hours ending 12/10/19 0718   Physical Exam  General in bed, tired and fatigued but easily arousable and follows all commands and is oriented x3, no focal deficits, [place].AT,PERRAL Supple Neck,No JVD, No cervical lymphadenopathy appriciated.  Symmetrical Chest wall movement, Good air movement bilaterally, CTAB RRR,No Gallops,Rubs or new Murmurs, No Parasternal Heave +ve B.Sounds, Abd Soft, No tenderness, No organomegaly appriciated, No rebound - guarding or rigidity. No Cyanosis, Clubbing or edema,  No new Rash or bruise       Data Review:    CBC Recent Labs  Lab 12/10/2019 1530 12/10/2019 1602 11/25/2019 2358 12/10/19 0128  WBC 7.9  --   --  8.8  HGB 15.0 15.0   15.3* 14.6 13.7  HCT 47.9* 44.0  45.0 43.0 43.6  PLT 282  --   --  195  MCV 90.9  --   --  91.8  MCH 28.5  --   --  28.8  MCHC 31.3  --   --  31.4  RDW 14.6  --   --  14.7  LYMPHSABS 1.0  --   --  0.7  MONOABS 0.2  --   --  0.3  EOSABS 0.0  --   --  0.0  BASOSABS 0.0  --   --  0.0    Recent Labs  Lab 12/03/2019 1530 11/26/2019 1544 12/02/2019 1602 12/08/2019 2221 12/06/2019 2233 12/14/2019 2358 12/10/19 0128  NA 144  --  144  144  --   --  143 145  K 3.6  --  3.6  3.5  --   --  3.1* 3.5  CL 105  --  107  --   --   --  108  CO2 25  --   --   --   --   --  25  GLUCOSE 116*  --  114*  --   --   --  137*  BUN 33*  --  37*  --   --   --  28*  CREATININE 1.31*  --  1.30*  --  1.23*  --  1.09*  CALCIUM 8.3*  --   --   --   --   --  8.0*  AST 48*  --   --   --   --   --  50*  ALT 21  --   --   --   --   --  20  ALKPHOS 71  --   --   --   --   --  70  BILITOT 0.9  --   --   --   --   --  0.4  ALBUMIN 3.0*  --   --   --   --   --  2.5*  MG 3.0*  --   --   --   --   --  2.6*  CRP  --   --   --   --   --   --  14.0*  DDIMER  --   --   --   --   --   --  >20.00*  PROCALCITON  --   --   --   --  0.22  --   --   LATICACIDVEN  --  2.3*  --  2.2*  --   --   --   INR 1.2  --   --   --   --   --   --     ------------------------------------------------------------------------------------------------------------------ No results for input(s): CHOL, HDL, LDLCALC, TRIG, CHOLHDL, LDLDIRECT in the last 72 hours.  No results found for: HGBA1C ------------------------------------------------------------------------------------------------------------------ No results for input(s): TSH, T4TOTAL, T3FREE, THYROIDAB in the last 72 hours.  Invalid input(s): FREET3  Cardiac Enzymes No results for input(s): CKMB, TROPONINI, MYOGLOBIN in the last 168 hours.  Invalid input(s): CK ------------------------------------------------------------------------------------------------------------------ No  results found for: BNP  Micro Results Recent Results (from the past 240  hour(s))  SARS Coronavirus 2 by RT PCR (hospital order, [facility]) Nasopharyngeal Nasopharyngeal Swab     Status: Abnormal   Collection Time: 09/13/2019  4:05 PM   Specimen: Nasopharyngeal Swab  Result Value Ref Range Status   SARS Coronavirus 2 POSITIVE (A) NEGATIVE Final    Comment: RESULT CALLED TO, READ BACK BY AND VERIFIED WITH: A COLEMAN RN 09/13/2019 AT 1722 SK (NOTE) SARS-CoV-2 target nucleic acids are DETECTED  SARS-CoV-2 RNA is generally detectable in upper respiratory specimens  during the acute phase of infection.  Positive results are indicative  of the presence of the identified virus, but do not rule out bacterial infection or co-infection with other pathogens not detected by the test.  Clinical correlation with patient history and  other diagnostic information is necessary to determine patient infection status.  The expected result is negative.  Fact Sheet for Patients:   BoilerBrush.com.cyhttps://www.fda.gov/media/136312/download   Fact Sheet for Healthcare Providers:   https://pope.com/https://www.fda.gov/media/136313/download    This test is not yet approved or cleared by the [place] FDA and  has been authorized for detection and/or diagnosis of SARS-CoV-2 by FDA under an Emergency Use Authorization (EUA).  This EUA will remain in effect (meaning this test  can be used) for the duration of  the COVID-19 declaration under Section 564(b)(1) of the Act, 21 U.S.C. section 360-bbb-3(b)(1), unless the authorization is terminated or revoked sooner.  Performed at [facility], [address] [place], [place] [address]     Radiology Reports DG Chest [place] View  Result Date: 01/10/20 CLINICAL DATA:  Questionable sepsis EXAM: PORTABLE CHEST 1 VIEW COMPARISON:  None. FINDINGS: There is hazy bilateral airspace opacification. There is no pneumothorax or large pleural effusion. The heart size  is unremarkable. Aortic calcifications are noted. IMPRESSION: Hazy appearance of both lung fields may be secondary to an atypical infectious process such as viral pneumonia or developing interstitial edema. Electronically Signed   By: Katherine Mantlehristopher  Green M.D.   On: 010/22/21 15:30

## 2019-12-10 NOTE — TOC Initial Note (Signed)
Transition of Care [facility]) - Initial/Assessment Note    Patient Details  Name: Janice Werner MRN: [id number] Date of Birth: Dec 14, 1950  Transition of [facility]) CM/SW Contact:    Lawerance Sabal, RN Phone Number: 12/10/2019, 4:47 PM  Clinical Narrative:             Patient admitted from home, lives alone, and was likely on the floor for several days  COVID-19 pneumonia along with toxic encephalopathy, dehydration and AKI.  BLE DVTs. Benefit check sent for DOACs.  Condition - Extremely Guarded ++    Expected Discharge Plan:  (TBD)     Patient Goals and CMS Choice        Expected Discharge Plan and Services Expected Discharge Plan:  (TBD)   Discharge Planning Services: CM Consult                                          Prior Living Arrangements/Services   Lives with:: Self                   Activities of Daily Living      Permission Sought/Granted                  Emotional Assessment              Admission diagnosis:  Guaiac positive stools [R19.5] Elevated troponin [R77.8] Acute hypoxemic respiratory failure due to COVID-19 (HCC) [U07.1, J96.01] Patient Active Problem List   Diagnosis Date Noted  . Acute hypoxemic respiratory failure due to COVID-19 (HCC) 12/13/2019  . Sepsis (HCC) 11/25/2019  . Elevated troponin 12/10/2019  . AKI (acute kidney injury) (HCC) 12/07/2019  . Poorly-controlled hypertension 07/11/2019  . Morbid obesity with body mass index (BMI) of 40.0 or higher (HCC) 07/11/2019  . Snoring 07/11/2019  . Hypercholesterolemia 07/11/2019  . Viral upper respiratory tract infection 07/11/2019  . Essential hypertension 12/07/2016  . Primary osteoarthritis of left hip 11/07/2016   PCP:  Roger Kill, PA-C Pharmacy:   [facility] [phone] - [place], [place] - [address] AT [facility] [address] [place] [address] Phone: [phone] Fax:  [phone]     Social Determinants of Health (SDOH) Interventions    Readmission Risk Interventions No flowsheet data found.

## 2019-12-11 DIAGNOSIS — R7989 Other specified abnormal findings of blood chemistry: Secondary | ICD-10-CM

## 2019-12-11 DIAGNOSIS — I82403 Acute embolism and thrombosis of unspecified deep veins of lower extremity, bilateral: Secondary | ICD-10-CM

## 2019-12-11 DIAGNOSIS — G9341 Metabolic encephalopathy: Secondary | ICD-10-CM

## 2019-12-11 DIAGNOSIS — N183 Chronic kidney disease, stage 3 unspecified: Secondary | ICD-10-CM

## 2019-12-11 LAB — PATHOLOGIST SMEAR REVIEW: Path Review: REACTIVE

## 2019-12-12 MED FILL — Medication: Qty: 2 | Status: AC

## 2019-12-14 LAB — CULTURE, BLOOD (SINGLE)
Culture: NO GROWTH
Culture: NO GROWTH

## 2019-12-20 NOTE — Progress Notes (Signed)
Patient is on comfort care. Family member aware. Patient's daughter talked  with her Mom.

## 2019-12-20 NOTE — Progress Notes (Signed)
During change of shift, upon assessment, pt noted not to be breathing with no pulse. Verified with Sarala Mahat RN. Time of death 70. Notified Dr. Randol Kern at [phone]. Mahat RN called and notifed daughter, Vita Barley, at [phone]. Daughter stated that family will be coming up to view the body before pt taken to morgue.   [facility] has been called. Spoke with Windy Carina and given reference number [phone].  Family not decided on funeral home at this time.

## 2019-12-20 NOTE — Death Summary Note (Signed)
DEATH SUMMARY   Patient Details  Name: MILANNI AYUB MRN: [id number] DOB: January 01, 1951  Admission/Discharge Information   Admit Date:  12/31/19  Date of Death: Date of Death: January 02, 2020  Time of Death: Time of Death: 0722  Length of Stay: 2  Referring Physician: Roger Kill, PA-C   Reason(s) for Hospitalization   Acute hypoxic respiratory failure due to COVID-19 pneumonia  Diagnoses  Preliminary cause of death:  Secondary Diagnoses (including complications and co-morbidities):  Principal Problem:   Acute hypoxemic respiratory failure due to COVID-19 [facility]) Active Problems:   Essential hypertension   Poorly-controlled hypertension   Morbid obesity with body mass index (BMI) of 40.0 or higher (HCC)   Hypercholesterolemia   Sepsis due to COVID-19 (HCC)   Elevated troponin   AKI (acute kidney injury) (HCC)   CKD (chronic kidney disease), stage III   Leg DVT (deep venous thromboembolism), acute, bilateral (HCC)   Elevated d-dimer   Acute metabolic encephalopathy   Brief Hospital Course (including significant findings, care, treatment, and services provided and events leading to death)  AVERLEE SWARTZ is a 69 y.o. year old female with past medical history of obesity, hyperlipidemia, CKD, she was brought to ED secondary to evaluation for altered mental status, patient has not been feeling well few days before admission, daughter was unable to reach her mother by phone, it was going straight to answering machine, when she went to check on her, she found her on the floor, likely for few days, EMS found patient to be severely hypoxic, where she was brought to ER, where she was diagnosed with acute hypoxic respiratory failure due to COVID-19 pneumonia, her chest x-ray significant for severe COVID-19 pneumonia, as well she was diagnosed with acute metabolic encephalopathy secondary to her Covid infection, patient probably at home minimally responsive for several days before  she was brought to the hospital, she is with dehydration, with AKI on CKD stage III, for which she was admitted to the hospital, unfortunately patient is unvaccinated, she has incurred severe lung parenchymal injury and COVID-19 per pneumonia, patient was treated with high-dose IV steroids, she was treated with remdesivir and baricitinib as well, is with severe hypoxia, with no improvement despite using maximum needed high flow oxygenation at 50 L/min on top of 15 L NRB, where she remained hypoxic on that as well, she was with extremely elevated D-dimers, she was kept empirically on full dose anticoagulation Lovenox, bilateral lower extremity came back for bilateral acute DVT, patient was septic on admission, with elevated lactic acid, altered mental status, and with elevated troponins, patient clinical condition continues to deteriorate, CODE STATUS was confirmed DNR, as well patient has been evaluated by nighttime physician, and PCM given her significant hypoxia on heated high flow nasal cannula and NRB, decision has been made to transition patient to comfort care, given her significant hypoxia, encephalopathy, she was started on as needed morphine and Ativan, patient passed away 7:22 AM on 01/02/2023, family were notified by staff and they were able to feel the body before she went to the morgue.    Pertinent Labs and Studies  Significant Diagnostic Studies DG Chest Port 1 View  Result Date: 2019/12/31 CLINICAL DATA:  Questionable sepsis EXAM: PORTABLE CHEST 1 VIEW COMPARISON:  None. FINDINGS: There is hazy bilateral airspace opacification. There is no pneumothorax or large pleural effusion. The heart size is unremarkable. Aortic calcifications are noted. IMPRESSION: Hazy appearance of both lung fields may be secondary to an atypical infectious process such  as viral pneumonia or developing interstitial edema. Electronically Signed   By: Katherine Mantlehristopher  Green M.D.   On: Jul 29, 2019 15:30   VAS US LOWER EXTREMITY  VENOUS (DVT)  Result Date: 12/10/2019  Lower Venous DVTStudy Indications: Elevated d-dimer.  Comparison Study: No prior studies. Performing Technologist: Jean Rosenthalachel Hodge  Examination Guidelines: A complete evaluation includes B-mode imaging, spectral Doppler, color Doppler, and power Doppler as needed of all accessible portions of each vessel. Bilateral testing is considered an integral part of a complete examination. Limited examinations for reoccurring indications may be performed as noted. The reflux portion of the exam is performed with the patient in reverse Trendelenburg.  +---------+---------------+---------+-----------+----------+--------------+  RIGHT     Compressibility Phasicity Spontaneity Properties Thrombus Aging  +---------+---------------+---------+-----------+----------+--------------+  CFV       Partial         Yes       Yes         Mobile     Acute           +---------+---------------+---------+-----------+----------+--------------+  SFJ       Full                                                             +---------+---------------+---------+-----------+----------+--------------+  FV Prox   Full                                                             +---------+---------------+---------+-----------+----------+--------------+  FV Mid    Full                                                             +---------+---------------+---------+-----------+----------+--------------+  FV Distal Full                                                             +---------+---------------+---------+-----------+----------+--------------+  PFV       Full                                                             +---------+---------------+---------+-----------+----------+--------------+  POP       None            No        No                     Acute           +---------+---------------+---------+-----------+----------+--------------+  PTV       None  Acute            +---------+---------------+---------+-----------+----------+--------------+  PERO      None                                             Acute           +---------+---------------+---------+-----------+----------+--------------+   +---------+---------------+---------+-----------+----------+--------------+  LEFT      Compressibility Phasicity Spontaneity Properties Thrombus Aging  +---------+---------------+---------+-----------+----------+--------------+  CFV       Full            Yes       Yes                                    +---------+---------------+---------+-----------+----------+--------------+  SFJ       Full                                                             +---------+---------------+---------+-----------+----------+--------------+  FV Prox   Full                                                             +---------+---------------+---------+-----------+----------+--------------+  FV Mid    Full                                                             +---------+---------------+---------+-----------+----------+--------------+  FV Distal Full                                                             +---------+---------------+---------+-----------+----------+--------------+  PFV       Full                                                             +---------+---------------+---------+-----------+----------+--------------+  POP       None            No        No                     Acute           +---------+---------------+---------+-----------+----------+--------------+  PTV       Full                                                             +---------+---------------+---------+-----------+----------+--------------+  PERO      None                                                             +---------+---------------+---------+-----------+----------+--------------+     Summary: RIGHT: - Findings consistent with acute deep vein thrombosis involving the right popliteal vein, right  posterior tibial veins, right peroneal veins, and right common femoral vein. - No cystic structure found in the popliteal fossa.  LEFT: - Findings consistent with acute deep vein thrombosis involving the left popliteal vein, and left peroneal veins. - No cystic structure found in the popliteal fossa.  *See table(s) above for measurements and observations. Electronically signed by Waverly Ferrari MD on 12/10/2019 at 4:47:17 PM.    Final     Microbiology Recent Results (from the past 240 hour(s))  Culture, blood (single)     Status: None (Preliminary result)   Collection Time: 11-Dec-2019  3:30 PM   Specimen: BLOOD LEFT ARM  Result Value Ref Range Status   Specimen Description BLOOD LEFT ARM  Final   Special Requests   Final    BOTTLES DRAWN AEROBIC AND ANAEROBIC Blood Culture results may not be optimal due to an inadequate volume of blood received in culture bottles   Culture   Final    NO GROWTH 2 DAYS Performed at [facility], [address], [place], [place] [address]    Report Status PENDING  Incomplete  Blood culture (routine single)     Status: None (Preliminary result)   Collection Time: 11/25/2019  3:45 PM   Specimen: BLOOD RIGHT ARM  Result Value Ref Range Status   Specimen Description BLOOD RIGHT ARM  Final   Special Requests   Final    BOTTLES DRAWN AEROBIC AND ANAEROBIC Blood Culture results may not be optimal due to an inadequate volume of blood received in culture bottles   Culture   Final    NO GROWTH 2 DAYS Performed at [facility], [address], [place], [place] [address]    Report Status PENDING  Incomplete  SARS Coronavirus 2 by RT PCR (hospital order, performed in [facility] hospital lab) Nasopharyngeal Nasopharyngeal Swab     Status: Abnormal   Collection Time: 12/06/2019  4:05 PM   Specimen: Nasopharyngeal Swab  Result Value Ref Range Status   SARS Coronavirus 2 POSITIVE (A) NEGATIVE Final    Comment: RESULT CALLED TO, READ BACK BY AND VERIFIED  WITH: A COLEMAN RN 11-Dec-2019 AT 1722 SK (NOTE) SARS-CoV-2 target nucleic acids are DETECTED  SARS-CoV-2 RNA is generally detectable in upper respiratory specimens  during the acute phase of infection.  Positive results are indicative  of the presence of the identified virus, but do not rule out bacterial infection or co-infection with other pathogens not detected by the test.  Clinical correlation with patient history and  other diagnostic information is necessary to determine patient infection status.  The expected result is negative.  Fact Sheet for Patients:   BoilerBrush.com.cy   Fact Sheet for Healthcare Providers:   https://pope.com/    This test is not yet approved or cleared by the [place] FDA and  has been authorized for detection and/or diagnosis of SARS-CoV-2 by FDA under an Emergency Use Authorization (EUA).  This EUA will remain in effect (  meaning this test  can be used) for the duration of  the COVID-19 declaration under Section 564(b)(1) of the Act, 21 U.S.C. section 360-bbb-3(b)(1), unless the authorization is terminated or revoked sooner.  Performed at [facility], [address] [place], [place] [address]   Urine culture     Status: Abnormal   Collection Time: 12/10/19  2:47 AM   Specimen: Urine, Random  Result Value Ref Range Status   Specimen Description URINE, RANDOM  Final   Special Requests   Final    NONE Performed at [facility], [address] [place], [place] [address]    Culture MULTIPLE SPECIES PRESENT, SUGGEST RECOLLECTION (A)  Final   Report Status 12/10/2019 FINAL  Final    Lab Basic Metabolic Panel: Recent Labs  Lab 12-21-2019 1530 Dec 21, 2019 1602 2019-12-21 2233 2019/12/21 2358 12/10/19 0128  NA 144 144   144  --  143 145  K 3.6 3.6   3.5  --  3.1* 3.5  CL 105 107  --   --  108  CO2 25  --   --   --  25  GLUCOSE 116* 114*  --   --  137*  BUN 33* 37*  --   --  28*   CREATININE 1.31* 1.30* 1.23*  --  1.09*  CALCIUM 8.3*  --   --   --  8.0*  MG 3.0*  --   --   --  2.6*  PHOS  --   --   --   --  3.2   Liver Function Tests: Recent Labs  Lab December 21, 2019 1530 12/10/19 0128  AST 48* 50*  ALT 21 20  ALKPHOS 71 70  BILITOT 0.9 0.4  PROT 7.1 6.3*  ALBUMIN 3.0* 2.5*   Recent Labs  Lab 12/21/2019 1530  LIPASE 26   No results for input(s): AMMONIA in the last 168 hours. CBC: Recent Labs  Lab 21-Dec-2019 1530 2019-12-21 1602 21-Dec-2019 2358 12/10/19 0128  WBC 7.9  --   --  8.8  NEUTROABS 6.6  --   --  7.8*  HGB 15.0 15.0   15.3* 14.6 13.7  HCT 47.9* 44.0   45.0 43.0 43.6  MCV 90.9  --   --  91.8  PLT 282  --   --  195   Cardiac Enzymes: Recent Labs  Lab 2019/12/21 1530  CKTOTAL 239*   Sepsis Labs: Recent Labs  Lab 21-Dec-2019 1530 21-Dec-2019 1544 21-Dec-2019 2221 12-21-2019 2233 12/10/19 0128  PROCALCITON  --   --   --  0.22  --   WBC 7.9  --   --   --  8.8  LATICACIDVEN  --  2.3* 2.2*  --   --     Procedures/Operations     Blake Goya 11/30/2019, 4:57 PM

## 2019-12-20 NOTE — Progress Notes (Signed)
Checked on patient.  As noted by NP Jovita Kussmaul, she discussed the patient's decline with her daughter, who agreed that comfort care is the best course of action at this point.   Unfortunately Janice Werner is not a good candidate for bipap, she is altered, already on very high flow oxygen.  I agree that her chance of survival is very low.  Comfort care is appropriate.   Patient is slightly tachypneic, nurse is getting her due dose of morphine now.

## 2019-12-20 NOTE — Progress Notes (Signed)
Patient transported from ED to 5W06 with no complications.

## 2019-12-20 DEATH — deceased

## 2020-03-30 ENCOUNTER — Ambulatory Visit: Payer: Medicare HMO | Admitting: Cardiology
